# Patient Record
Sex: Male | Born: 1990 | Race: White | Hispanic: No | Marital: Single | Smoking: Current every day smoker
Health system: Southern US, Community
[De-identification: ages and names within clinical notes are randomized; demographics above are authoritative.]

## PROBLEM LIST (undated history)

## (undated) DIAGNOSIS — K3580 Unspecified acute appendicitis: Secondary | ICD-10-CM

---

## 2012-07-01 ENCOUNTER — Emergency Department (HOSPITAL_COMMUNITY): Payer: BC Managed Care – PPO

## 2012-07-01 ENCOUNTER — Encounter (HOSPITAL_COMMUNITY)
Admission: EM | Disposition: A | Discharge status: Home / Self Care | Payer: Self-pay | Source: Home / Self Care | Attending: Emergency Medicine

## 2012-07-01 ENCOUNTER — Encounter (HOSPITAL_COMMUNITY): Payer: Self-pay | Admitting: Registered Nurse

## 2012-07-01 ENCOUNTER — Encounter (HOSPITAL_COMMUNITY): Payer: Self-pay | Admitting: Emergency Medicine

## 2012-07-01 ENCOUNTER — Observation Stay (HOSPITAL_COMMUNITY)
Admission: EM | Admit: 2012-07-01 | Discharge: 2012-07-02 | Disposition: A | Discharge status: Home / Self Care | Payer: BC Managed Care – PPO | Attending: General Surgery | Admitting: General Surgery

## 2012-07-01 ENCOUNTER — Emergency Department (HOSPITAL_COMMUNITY): Payer: BC Managed Care – PPO | Admitting: Registered Nurse

## 2012-07-01 DIAGNOSIS — F17200 Nicotine dependence, unspecified, uncomplicated: Secondary | ICD-10-CM | POA: Insufficient documentation

## 2012-07-01 DIAGNOSIS — K3580 Unspecified acute appendicitis: Secondary | ICD-10-CM | POA: Diagnosis present

## 2012-07-01 DIAGNOSIS — R1031 Right lower quadrant pain: Principal | ICD-10-CM | POA: Insufficient documentation

## 2012-07-01 HISTORY — PX: LAPAROSCOPIC APPENDECTOMY: SHX408

## 2012-07-01 HISTORY — DX: Unspecified acute appendicitis: K35.80

## 2012-07-01 LAB — COMPREHENSIVE METABOLIC PANEL
AST: 35 U/L (ref 0–37)
Albumin: 4.6 g/dL (ref 3.5–5.2)
Chloride: 99 mEq/L (ref 96–112)
Creatinine, Ser: 1.02 mg/dL (ref 0.50–1.35)
Potassium: 3.8 mEq/L (ref 3.5–5.1)
Total Bilirubin: 0.6 mg/dL (ref 0.3–1.2)

## 2012-07-01 LAB — CBC WITH DIFFERENTIAL/PLATELET
Eosinophils Relative: 0 % (ref 0–5)
HCT: 42 % (ref 39.0–52.0)
Hemoglobin: 15 g/dL (ref 13.0–17.0)
Lymphocytes Relative: 6 % — ABNORMAL LOW (ref 12–46)
Lymphs Abs: 1.3 10*3/uL (ref 0.7–4.0)
MCV: 87.9 fL (ref 78.0–100.0)
Monocytes Absolute: 0.7 10*3/uL (ref 0.1–1.0)
Monocytes Relative: 3 % (ref 3–12)
RDW: 12 % (ref 11.5–15.5)
WBC: 23.5 10*3/uL — ABNORMAL HIGH (ref 4.0–10.5)

## 2012-07-01 SURGERY — APPENDECTOMY, LAPAROSCOPIC
Anesthesia: General | Site: Abdomen | Wound class: Contaminated

## 2012-07-01 MED ORDER — 0.9 % SODIUM CHLORIDE (POUR BTL) OPTIME
TOPICAL | Status: DC | PRN
  Administered 2012-07-01: 1000 mL

## 2012-07-01 MED ORDER — LACTATED RINGERS IV SOLN: INTRAVENOUS | Status: DC

## 2012-07-01 MED ORDER — PROMETHAZINE HCL 25 MG/ML IJ SOLN: 6.2500 mg | INTRAMUSCULAR | Status: DC | PRN

## 2012-07-01 MED ORDER — LACTATED RINGERS IR SOLN
Status: DC | PRN
  Administered 2012-07-01: 1000 mL

## 2012-07-01 MED ORDER — IOHEXOL 300 MG/ML  SOLN
100.0000 mL | Freq: Once | INTRAMUSCULAR | Status: AC | PRN
  Administered 2012-07-01: 100 mL via INTRAVENOUS

## 2012-07-01 MED ORDER — CIPROFLOXACIN IN D5W 400 MG/200ML IV SOLN
400.0000 mg | Freq: Once | INTRAVENOUS | Status: AC
  Administered 2012-07-01: 400 mg via INTRAVENOUS
  Filled 2012-07-01: qty 200

## 2012-07-01 MED ORDER — METRONIDAZOLE IN NACL 5-0.79 MG/ML-% IV SOLN
500.0000 mg | Freq: Three times a day (TID) | INTRAVENOUS | Status: DC
  Administered 2012-07-01: 500 mg via INTRAVENOUS
  Filled 2012-07-01 (×2): qty 100

## 2012-07-01 MED ORDER — SUCCINYLCHOLINE CHLORIDE 20 MG/ML IJ SOLN
INTRAMUSCULAR | Status: DC | PRN
  Administered 2012-07-01: 200 mg via INTRAVENOUS

## 2012-07-01 MED ORDER — HYDROMORPHONE HCL PF 1 MG/ML IJ SOLN: 0.2500 mg | INTRAMUSCULAR | Status: DC | PRN

## 2012-07-01 MED ORDER — IOHEXOL 300 MG/ML  SOLN
50.0000 mL | Freq: Once | INTRAMUSCULAR | Status: AC | PRN
  Administered 2012-07-01: 50 mL via ORAL

## 2012-07-01 MED ORDER — ONDANSETRON HCL 4 MG/2ML IJ SOLN
4.0000 mg | Freq: Once | INTRAMUSCULAR | Status: AC
  Administered 2012-07-01: 4 mg via INTRAVENOUS
  Filled 2012-07-01: qty 2

## 2012-07-01 MED ORDER — LIDOCAINE-EPINEPHRINE (PF) 1 %-1:200000 IJ SOLN
INTRAMUSCULAR | Status: AC
  Filled 2012-07-01: qty 10

## 2012-07-01 MED ORDER — ROCURONIUM BROMIDE 100 MG/10ML IV SOLN
INTRAVENOUS | Status: DC | PRN
  Administered 2012-07-01: 30 mg via INTRAVENOUS
  Administered 2012-07-01: 10 mg via INTRAVENOUS

## 2012-07-01 MED ORDER — PROPOFOL 10 MG/ML IV BOLUS
INTRAVENOUS | Status: DC | PRN
  Administered 2012-07-01: 200 mg via INTRAVENOUS

## 2012-07-01 MED ORDER — HYDROMORPHONE HCL PF 1 MG/ML IJ SOLN
1.0000 mg | Freq: Once | INTRAMUSCULAR | Status: AC
  Administered 2012-07-01: 1 mg via INTRAVENOUS
  Filled 2012-07-01: qty 1

## 2012-07-01 MED ORDER — DEXAMETHASONE SODIUM PHOSPHATE 10 MG/ML IJ SOLN
INTRAMUSCULAR | Status: DC | PRN
  Administered 2012-07-01: 10 mg via INTRAVENOUS

## 2012-07-01 MED ORDER — SODIUM CHLORIDE 0.9 % IV BOLUS (SEPSIS)
1000.0000 mL | Freq: Once | INTRAVENOUS | Status: AC
  Administered 2012-07-01: 1000 mL via INTRAVENOUS

## 2012-07-01 MED ORDER — LACTATED RINGERS IV SOLN
INTRAVENOUS | Status: DC | PRN
  Administered 2012-07-01: 23:00:00 via INTRAVENOUS

## 2012-07-01 MED ORDER — MIDAZOLAM HCL 5 MG/5ML IJ SOLN
INTRAMUSCULAR | Status: DC | PRN
  Administered 2012-07-01: 2 mg via INTRAVENOUS

## 2012-07-01 MED ORDER — LIDOCAINE HCL (CARDIAC) 20 MG/ML IV SOLN
INTRAVENOUS | Status: DC | PRN
  Administered 2012-07-01: 100 mg via INTRAVENOUS

## 2012-07-01 MED ORDER — FENTANYL CITRATE 0.05 MG/ML IJ SOLN
INTRAMUSCULAR | Status: DC | PRN
  Administered 2012-07-01 – 2012-07-02 (×5): 50 ug via INTRAVENOUS

## 2012-07-01 MED ORDER — BUPIVACAINE HCL (PF) 0.25 % IJ SOLN
INTRAMUSCULAR | Status: AC
  Filled 2012-07-01: qty 30

## 2012-07-01 SURGICAL SUPPLY — 38 items
APPLIER CLIP ROT 10 11.4 M/L (STAPLE)
CANISTER SUCTION 2500CC (MISCELLANEOUS) IMPLANT
CHLORAPREP W/TINT 26ML (MISCELLANEOUS) ×2 IMPLANT
CLIP APPLIE ROT 10 11.4 M/L (STAPLE) IMPLANT
CLOTH BEACON ORANGE TIMEOUT ST (SAFETY) ×2 IMPLANT
COVER SURGICAL LIGHT HANDLE (MISCELLANEOUS) ×2 IMPLANT
CUTTER FLEX LINEAR 45M (STAPLE) ×4 IMPLANT
DECANTER SPIKE VIAL GLASS SM (MISCELLANEOUS) ×6 IMPLANT
DERMABOND ADVANCED (GAUZE/BANDAGES/DRESSINGS) ×1
DERMABOND ADVANCED .7 DNX12 (GAUZE/BANDAGES/DRESSINGS) ×1 IMPLANT
ELECT REM PT RETURN 9FT ADLT (ELECTROSURGICAL) ×2
ELECTRODE REM PT RTRN 9FT ADLT (ELECTROSURGICAL) ×1 IMPLANT
GLOVE SURG SS PI 7.5 STRL IVOR (GLOVE) ×4 IMPLANT
GOWN STRL NON-REIN LRG LVL3 (GOWN DISPOSABLE) ×4 IMPLANT
GOWN STRL REIN XL XLG (GOWN DISPOSABLE) ×2 IMPLANT
HANDLE STAPLE EGIA 4 XL (STAPLE) ×2 IMPLANT
KIT BASIN OR (CUSTOM PROCEDURE TRAY) ×2 IMPLANT
KIT ROOM TURNOVER OR (KITS) ×2 IMPLANT
NS IRRIG 1000ML POUR BTL (IV SOLUTION) ×2 IMPLANT
PAD ARMBOARD 7.5X6 YLW CONV (MISCELLANEOUS) ×4 IMPLANT
POUCH SPECIMEN RETRIEVAL 10MM (ENDOMECHANICALS) ×2 IMPLANT
RELOAD 45 VASCULAR/THIN (ENDOMECHANICALS) IMPLANT
RELOAD EGIA 45 MED/THCK PURPLE (STAPLE) ×2 IMPLANT
RELOAD STAPLE TA45 3.5 REG BLU (ENDOMECHANICALS) IMPLANT
SCALPEL HARMONIC ACE (MISCELLANEOUS) ×2 IMPLANT
SCISSORS LAP 5X35 DISP (ENDOMECHANICALS) IMPLANT
SET IRRIG TUBING LAPAROSCOPIC (IRRIGATION / IRRIGATOR) ×2 IMPLANT
SLEEVE ENDOPATH XCEL 5M (ENDOMECHANICALS) ×2 IMPLANT
SPECIMEN JAR SMALL (MISCELLANEOUS) ×2 IMPLANT
SUT MNCRL AB 4-0 PS2 18 (SUTURE) ×2 IMPLANT
SUT VICRYL 0 UR6 27IN ABS (SUTURE) ×2 IMPLANT
TOWEL OR 17X24 6PK STRL BLUE (TOWEL DISPOSABLE) ×2 IMPLANT
TOWEL OR 17X26 10 PK STRL BLUE (TOWEL DISPOSABLE) ×2 IMPLANT
TRAY FOLEY CATH 14FR (SET/KITS/TRAYS/PACK) ×2 IMPLANT
TRAY LAP CHOLE (CUSTOM PROCEDURE TRAY) ×2 IMPLANT
TROCAR XCEL BLUNT TIP 100MML (ENDOMECHANICALS) ×2 IMPLANT
TROCAR XCEL NON-BLD 5MMX100MML (ENDOMECHANICALS) ×2 IMPLANT
WATER STERILE IRR 1000ML POUR (IV SOLUTION) IMPLANT

## 2012-07-01 NOTE — H&P (Signed)
Reason for Consult:appendicitis Referring Physician: [name] is an 22 y.o. male.  HPI: this patient comes to the emergency room for evaluation of abdominal pain. He awoke this morning at about 10 AM not feeling right but not really having any discomfort either. He went to work as throughout the day of he continued to feel ill and bloated. He began having some generalized abdominal pain which throughout the day localized to the right lower quadrant. He thought that he would get some improvement if he moved his bowels but he got no relief with a small bowel movement. He began having some nausea and vomiting but denied any other symptoms such as fevers or chills or urinary symptoms. He presented to an urgent care facility where it was felt that he had appendicitis which is 21,000 white count and he was referred to the emergency room for further evaluation. In the emergency room he has continued to have persistent right lower quadrant pain, a white blood cell count of 23,000, and a CT scan was obtained which demonstrated signs concerning for acute appendicitis.  History reviewed. No pertinent past medical history.  History reviewed. No pertinent past surgical history.  Family History  Problem Relation Age of Onset  . Cancer Other     Social History:  reports that he has been smoking.  He does not have any smokeless tobacco history on file. He reports that  drinks alcohol. He reports that he does not use illicit drugs.  Allergies:  Allergies  Allergen Reactions  . Amoxicillin   . Sulfa Antibiotics     Medications: I have reviewed the patient's current medications.  Results for orders placed during the hospital encounter of 07/01/12 (from the past 48 hour(s))  COMPREHENSIVE METABOLIC PANEL     Status: Abnormal   Collection Time    07/01/12  7:35 PM      Result Value Range   Sodium 135  135 - 145 mEq/L   Potassium 3.8  3.5 - 5.1 mEq/L   Chloride 99  96 - 112 mEq/L   CO2 26   19 - 32 mEq/L   Glucose, Bld 114 (*) 70 - 99 mg/dL   BUN 10  6 - 23 mg/dL   Creatinine, Ser 1.61  0.50 - 1.35 mg/dL   Calcium 9.8  8.4 - 09.6 mg/dL   Total Protein 8.1  6.0 - 8.3 g/dL   Albumin 4.6  3.5 - 5.2 g/dL   AST 35  0 - 37 U/L   ALT 58 (*) 0 - 53 U/L   Alkaline Phosphatase 70  39 - 117 U/L   Total Bilirubin 0.6  0.3 - 1.2 mg/dL   GFR calc non Af Amer >90  >90 mL/min   GFR calc Af Amer >90  >90 mL/min   Comment:            The eGFR has been calculated     using the CKD EPI equation.     This calculation has not been     validated in all clinical     situations.     eGFR's persistently     <90 mL/min signify     possible Chronic Kidney Disease.  LIPASE, BLOOD     Status: None   Collection Time    07/01/12  7:35 PM      Result Value Range   Lipase 22  11 - 59 U/L  CBC WITH DIFFERENTIAL     Status: Abnormal  Collection Time    07/01/12  7:35 PM      Result Value Range   WBC 23.5 (*) 4.0 - 10.5 K/uL   RBC 4.78  4.22 - 5.81 MIL/uL   Hemoglobin 15.0  13.0 - 17.0 g/dL   HCT 40.9  81.1 - 91.4 %   MCV 87.9  78.0 - 100.0 fL   MCH 31.4  26.0 - 34.0 pg   MCHC 35.7  30.0 - 36.0 g/dL   RDW 78.2  95.6 - 21.3 %   Platelets 233  150 - 400 K/uL   Neutrophils Relative 91 (*) 43 - 77 %   Neutro Abs 21.4 (*) 1.7 - 7.7 K/uL   Lymphocytes Relative 6 (*) 12 - 46 %   Lymphs Abs 1.3  0.7 - 4.0 K/uL   Monocytes Relative 3  3 - 12 %   Monocytes Absolute 0.7  0.1 - 1.0 K/uL   Eosinophils Relative 0  0 - 5 %   Eosinophils Absolute 0.0  0.0 - 0.7 K/uL   Basophils Relative 0  0 - 1 %   Basophils Absolute 0.0  0.0 - 0.1 K/uL    Ct Abdomen Pelvis W Contrast  07/01/2012  *RADIOLOGY REPORT*  Clinical Data: Right lower quadrant pain.  CT ABDOMEN AND PELVIS WITH CONTRAST  Technique:  Multidetector CT imaging of the abdomen and pelvis was performed following the standard protocol during bolus administration of intravenous contrast.  Contrast:  100 ml Omnipaque-300.  Comparison: 04/19/2010   Findings: Lung bases are clear.  No effusions.  Heart is normal size.  Liver, gallbladder, stomach, spleen, pancreas, adrenals and kidneys are unremarkable.  Tiny low density area within the mid pole of the right kidney compatible with small cysts, stable since prior study.  There is enlargement of the retrocecal appendix with surrounding inflammatory changes compatible with acute appendicitis.  No evidence of perforation at this time.  Large and small bowel are unremarkable.  Aorta and iliac vessels are normal caliber.  No acute bony abnormality.  IMPRESSION: Changes of acute appendicitis.  Appendix is retrocecal.   Original Report Authenticated By: Charlett Nose, M.D.     All other review of systems negative or noncontributory except as stated in the HPI   Blood pressure 124/67, pulse 78, temperature 98.3 F (36.8 C), temperature source Oral, resp. rate 16, SpO2 98.00%. General appearance: alert, cooperative and no distress Head: Normocephalic, without obvious abnormality, atraumatic Neck: no JVD and supple, symmetrical, trachea midline Resp: nonlabored Cardio: normal rate, regular GI: soft, focal right lower and right sided tenderness, with voluntary guarding, ND Extremities: extremities normal, atraumatic, no cyanosis or edema Skin: Skin color, texture, turgor normal. No rashes or lesions Neurologic: Grossly normal  Assessment/Plan: Abdominal pain, most likely acute appendicitis He has a history and physical exam which is fairly classic for acute appendicitis. He also has an elevated white blood cell count of 23,000 and a CT scan also concerning for acute appendicitis. I have reviewed his x-rays and I have recommended appendectomy for the patient.  I discussed with him the options and the pros and cons and risks and benefits and he would like to proceed with diagnostic laparoscopy, appendectomy and possible open surgery. I did discuss with the patient the risks of infection, bleeding, pain,  scarring, need for open surgery, injury to bowel or surrounding structures, abscess, postoperative infection and injury to the ureters and he expressed understanding and would like to proceed with diagnostic laparoscopy and appendectomy.  Lodema Pilot  DAVID 07/01/2012, 10:21 PM

## 2012-07-01 NOTE — ED Provider Notes (Signed)
History     CSN: [id number]  Arrival date & time 07/01/12  1916   First MD Initiated Contact with Patient 07/01/12 1928      Chief Complaint  Patient presents with  . Abdominal Pain     HPI Pt is c/o right lower quadrant pain and pain around the umbilicus that started about noon today Pt went to [facility] and was sent here to r/o appendicitis FPt had lab work done there and wbc was 21.4   History reviewed. No pertinent past medical history.  History reviewed. No pertinent past surgical history.  Family History  Problem Relation Age of Onset  . Cancer Other     History  Substance Use Topics  . Smoking status: Current Every Day Smoker  . Smokeless tobacco: Not on file  . Alcohol Use: Yes     Comment: occ      Review of Systems All other systems reviewed and are negative Allergies  Amoxicillin and Sulfa antibiotics  Home Medications  No current outpatient prescriptions on file.  BP 124/67  Pulse 78  Temp(Src) 98.3 F (36.8 C) (Oral)  Resp 16  SpO2 98%  Physical Exam  Nursing note and vitals reviewed. Constitutional: He is oriented to person, place, and time. He appears well-developed and well-nourished. No distress.  HENT:  Head: Normocephalic and atraumatic.  Eyes: Pupils are equal, round, and reactive to light.  Neck: Normal range of motion.  Cardiovascular: Normal rate and intact distal pulses.   Pulmonary/Chest: No respiratory distress.  Abdominal: Normal appearance. He exhibits no distension. There is tenderness in the right lower quadrant. There is guarding.  Musculoskeletal: Normal range of motion.  Neurological: He is alert and oriented to person, place, and time. No cranial nerve deficit.  Skin: Skin is warm and dry. No rash noted.  Psychiatric: He has a normal mood and affect. His behavior is normal.    ED Course  Procedures (including critical care time)  Labs Reviewed  COMPREHENSIVE METABOLIC PANEL - Abnormal; Notable  for the following:    Glucose, Bld 114 (*)    ALT 58 (*)    All other components within normal limits  CBC WITH DIFFERENTIAL - Abnormal; Notable for the following:    WBC 23.5 (*)    Neutrophils Relative 91 (*)    Neutro Abs 21.4 (*)    Lymphocytes Relative 6 (*)    All other components within normal limits  LIPASE, BLOOD   Ct Abdomen Pelvis W Contrast  07/01/2012  *RADIOLOGY REPORT*  Clinical Data: Right lower quadrant pain.  CT ABDOMEN AND PELVIS WITH CONTRAST  Technique:  Multidetector CT imaging of the abdomen and pelvis was performed following the standard protocol during bolus administration of intravenous contrast.  Contrast:  100 ml Omnipaque-300.  Comparison: 04/19/2010  Findings: Lung bases are clear.  No effusions.  Heart is normal size.  Liver, gallbladder, stomach, spleen, pancreas, adrenals and kidneys are unremarkable.  Tiny low density area within the mid pole of the right kidney compatible with small cysts, stable since prior study.  There is enlargement of the retrocecal appendix with surrounding inflammatory changes compatible with acute appendicitis.  No evidence of perforation at this time.  Large and small bowel are unremarkable.  Aorta and iliac vessels are normal caliber.  No acute bony abnormality.  IMPRESSION: Changes of acute appendicitis.  Appendix is retrocecal.   Original Report Authenticated By: Charlett Nose, M.D.      1. Acute appendicitis  MDM         Nelia Shi, MD 07/01/12 2133

## 2012-07-01 NOTE — Anesthesia Preprocedure Evaluation (Addendum)
Anesthesia Evaluation  Patient identified by MRN, date of birth, ID band Patient awake    Reviewed: Allergy & Precautions, H&P , NPO status , Patient's Chart, lab work & pertinent test results  Airway Mallampati: II TM Distance: >3 FB Neck ROM: Full    Dental  (+) Teeth Intact and Dental Advisory Given   Pulmonary Current Smoker,  breath sounds clear to auscultation  Pulmonary exam normal       Cardiovascular negative cardio ROS  Rhythm:Regular Rate:Normal     Neuro/Psych negative neurological ROS  negative psych ROS   GI/Hepatic negative GI ROS, Neg liver ROS,   Endo/Other  negative endocrine ROS  Renal/GU negative Renal ROS  negative genitourinary   Musculoskeletal negative musculoskeletal ROS (+)   Abdominal   Peds  Hematology negative hematology ROS (+)   Anesthesia Other Findings   Reproductive/Obstetrics negative OB ROS                           Anesthesia Physical Anesthesia Plan  ASA: I and emergent  Anesthesia Plan: General   Post-op Pain Management:    Induction: Intravenous, Rapid sequence and Cricoid pressure planned  Airway Management Planned: Oral ETT  Additional Equipment:   Intra-op Plan:   Post-operative Plan: Extubation in OR  Informed Consent: I have reviewed the patients History and Physical, chart, labs and discussed the procedure including the risks, benefits and alternatives for the proposed anesthesia with the patient or authorized representative who has indicated his/her understanding and acceptance.     Plan Discussed with: CRNA  Anesthesia Plan Comments:         Anesthesia Quick Evaluation

## 2012-07-01 NOTE — ED Notes (Signed)
Pt is c/o right lower quadrant pain and pain around the umbilicus that started about noon today  Pt went to [facility] and was sent here to r/o appendicitis  FPt had lab work done there and wbc was 21.4

## 2012-07-01 NOTE — Preoperative (Signed)
Beta Blockers   Reason not to administer Beta Blockers:Not Applicable 

## 2012-07-02 ENCOUNTER — Encounter (HOSPITAL_COMMUNITY): Payer: Self-pay | Admitting: General Surgery

## 2012-07-02 ENCOUNTER — Encounter (INDEPENDENT_AMBULATORY_CARE_PROVIDER_SITE_OTHER): Payer: Self-pay | Admitting: General Surgery

## 2012-07-02 DIAGNOSIS — K3580 Unspecified acute appendicitis: Secondary | ICD-10-CM | POA: Diagnosis present

## 2012-07-02 HISTORY — DX: Unspecified acute appendicitis: K35.80

## 2012-07-02 LAB — CBC WITH DIFFERENTIAL/PLATELET
Basophils Absolute: 0 10*3/uL (ref 0.0–0.1)
Basophils Relative: 0 % (ref 0–1)
Eosinophils Absolute: 0 10*3/uL (ref 0.0–0.7)
Eosinophils Relative: 0 % (ref 0–5)
HCT: 38.9 % — ABNORMAL LOW (ref 39.0–52.0)
Hemoglobin: 13.5 g/dL (ref 13.0–17.0)
MCH: 30.7 pg (ref 26.0–34.0)
MCHC: 34.7 g/dL (ref 30.0–36.0)
MCV: 88.4 fL (ref 78.0–100.0)
Monocytes Absolute: 0.2 10*3/uL (ref 0.1–1.0)
Monocytes Relative: 1 % — ABNORMAL LOW (ref 3–12)
RDW: 12 % (ref 11.5–15.5)

## 2012-07-02 MED ORDER — ENOXAPARIN SODIUM 40 MG/0.4ML ~~LOC~~ SOLN
40.0000 mg | Freq: Every day | SUBCUTANEOUS | Status: DC
  Filled 2012-07-02: qty 0.4

## 2012-07-02 MED ORDER — ONDANSETRON HCL 4 MG/2ML IJ SOLN: 4.0000 mg | Freq: Four times a day (QID) | INTRAMUSCULAR | Status: DC | PRN

## 2012-07-02 MED ORDER — NEOSTIGMINE METHYLSULFATE 1 MG/ML IJ SOLN
INTRAMUSCULAR | Status: DC | PRN
  Administered 2012-07-02: 5 mg via INTRAVENOUS

## 2012-07-02 MED ORDER — ONDANSETRON HCL 4 MG/2ML IJ SOLN
INTRAMUSCULAR | Status: DC | PRN
  Administered 2012-07-02: 4 mg via INTRAVENOUS

## 2012-07-02 MED ORDER — MORPHINE SULFATE 2 MG/ML IJ SOLN
1.0000 mg | INTRAMUSCULAR | Status: DC | PRN
  Administered 2012-07-02: 2 mg via INTRAVENOUS
  Filled 2012-07-02: qty 1

## 2012-07-02 MED ORDER — LACTATED RINGERS IV SOLN
INTRAVENOUS | Status: DC
  Administered 2012-07-02: 125 mL/h via INTRAVENOUS

## 2012-07-02 MED ORDER — CIPROFLOXACIN IN D5W 400 MG/200ML IV SOLN
400.0000 mg | Freq: Once | INTRAVENOUS | Status: AC
  Administered 2012-07-02: 400 mg via INTRAVENOUS
  Filled 2012-07-02: qty 200

## 2012-07-02 MED ORDER — ACETAMINOPHEN 325 MG PO TABS: ORAL_TABLET | ORAL | Status: DC

## 2012-07-02 MED ORDER — HYDROCODONE-ACETAMINOPHEN 5-325 MG PO TABS
1.0000 | ORAL_TABLET | ORAL | Status: DC | PRN
  Administered 2012-07-02 (×3): 2 via ORAL
  Filled 2012-07-02 (×3): qty 2

## 2012-07-02 MED ORDER — HYDROCODONE-ACETAMINOPHEN 5-325 MG PO TABS: 1.0000 | ORAL_TABLET | ORAL | Status: DC | PRN

## 2012-07-02 MED ORDER — METRONIDAZOLE IN NACL 5-0.79 MG/ML-% IV SOLN
500.0000 mg | Freq: Three times a day (TID) | INTRAVENOUS | Status: DC
  Administered 2012-07-02: 500 mg via INTRAVENOUS
  Filled 2012-07-02 (×2): qty 100

## 2012-07-02 MED ORDER — GLYCOPYRROLATE 0.2 MG/ML IJ SOLN
INTRAMUSCULAR | Status: DC | PRN
  Administered 2012-07-02: .8 mg via INTRAVENOUS

## 2012-07-02 MED ORDER — ONDANSETRON HCL 4 MG PO TABS: 4.0000 mg | ORAL_TABLET | Freq: Four times a day (QID) | ORAL | Status: DC | PRN

## 2012-07-02 MED ORDER — KETOROLAC TROMETHAMINE 30 MG/ML IJ SOLN
30.0000 mg | Freq: Once | INTRAMUSCULAR | Status: AC | PRN
  Administered 2012-07-02: 30 mg via INTRAVENOUS
  Filled 2012-07-02: qty 1

## 2012-07-02 MED ORDER — LIDOCAINE-EPINEPHRINE (PF) 1 %-1:200000 IJ SOLN
INTRAMUSCULAR | Status: DC | PRN
  Administered 2012-07-02: 15 mL

## 2012-07-02 MED ORDER — BUPIVACAINE HCL 0.25 % IJ SOLN
INTRAMUSCULAR | Status: DC | PRN
  Administered 2012-07-02: 15 mL

## 2012-07-02 NOTE — Progress Notes (Signed)
1 Day Post-Op  Subjective: Pretty sore when I came in but he has been up walking and feels better now.  Able to eat this AM.  Objective: Vital signs in last 24 hours: Temp:  [97.8 F (36.6 C)-98.3 F (36.8 C)] 97.8 F (36.6 C) (02/27 0615) Pulse Rate:  [64-108] 74 (02/27 0615) Resp:  [15-20] 18 (02/27 0615) BP: (100-165)/(57-81) 100/61 mmHg (02/27 0615) SpO2:  [96 %-100 %] 96 % (02/27 0615) Weight:  [191 lb (86.637 kg)] 191 lb (86.637 kg) (02/27 0141) Last BM Date: 07/01/12 WBC down to 12.6, Diet regular  Intake/Output from previous day: 02/26 0701 - 02/27 0700 In: 2800 [P.O.:600; I.V.:2200] Out: 1610 [Urine:1600; Blood:10] Intake/Output this shift:    General appearance: alert, cooperative and no distress GI: soft, still tender, incisions look fine.  Lab Results:   Recent Labs  07/01/12 1935 07/02/12 0445  WBC 23.5* 12.6*  HGB 15.0 13.5  HCT 42.0 38.9*  PLT 233 207    BMET  Recent Labs  07/01/12 1935  NA 135  K 3.8  CL 99  CO2 26  GLUCOSE 114*  BUN 10  CREATININE 1.02  CALCIUM 9.8   PT/INR No results found for this basename: LABPROT, INR,  in the last 72 hours   Recent Labs Lab 07/01/12 1935  AST 35  ALT 58*  ALKPHOS 70  BILITOT 0.6  PROT 8.1  ALBUMIN 4.6     Lipase     Component Value Date/Time   LIPASE 22 07/01/2012 1935     Studies/Results: Ct Abdomen Pelvis W Contrast  07/01/2012  *RADIOLOGY REPORT*  Clinical Data: Right lower quadrant pain.  CT ABDOMEN AND PELVIS WITH CONTRAST  Technique:  Multidetector CT imaging of the abdomen and pelvis was performed following the standard protocol during bolus administration of intravenous contrast.  Contrast:  100 ml Omnipaque-300.  Comparison: 04/19/2010  Findings: Lung bases are clear.  No effusions.  Heart is normal size.  Liver, gallbladder, stomach, spleen, pancreas, adrenals and kidneys are unremarkable.  Tiny low density area within the mid pole of the right kidney compatible with small  cysts, stable since prior study.  There is enlargement of the retrocecal appendix with surrounding inflammatory changes compatible with acute appendicitis.  No evidence of perforation at this time.  Large and small bowel are unremarkable.  Aorta and iliac vessels are normal caliber.  No acute bony abnormality.  IMPRESSION: Changes of acute appendicitis.  Appendix is retrocecal.   Original Report Authenticated By: Charlett Nose, M.D.     Medications: . ciprofloxacin  400 mg Intravenous Once  . enoxaparin (LOVENOX) injection  40 mg Subcutaneous Daily  . metronidazole  500 mg Intravenous Q8H    Assessment/Plan Acute appendicitis s/p Laparoscopic appendectomy. 07/02/12 Dr. Lodema Pilot.    Plan:  Hope to send home later today.  LOS: 1 day    Danique Hartsough 07/02/2012

## 2012-07-02 NOTE — Discharge Summary (Signed)
Physician Discharge Summary  Patient ID: Nathaniel Cabrera MRN: [id number] DOB/AGE: [birth year] 21 y.o.  Admit date: 07/01/2012 Discharge date: 07/02/2012  Admission Diagnoses: Acute appendicitis    Discharge Diagnoses: Acute appendicitis   Principal Problem:   Acute appendicitis without mention of peritonitis   PROCEDURES:  s/p Laparoscopic appendectomy. 07/02/12 Dr. Lodema Pilot.      Hospital Course:  this patient comes to the emergency room for evaluation of abdominal pain. He awoke this morning at about 10 AM not feeling right but not really having any discomfort either. He went to work as throughout the day of he continued to feel ill and bloated. He began having some generalized abdominal pain which throughout the day localized to the right lower quadrant. He thought that he would get some improvement if he moved his bowels but he got no relief with a small bowel movement. He began having some nausea and vomiting but denied any other symptoms such as fevers or chills or urinary symptoms. He presented to an urgent care facility where it was felt that he had appendicitis which is 21,000 white count and he was referred to the emergency room for further evaluation. In the emergency room he has continued to have persistent right lower quadrant pain, a white blood cell count of 23,000, and a CT scan was obtained which demonstrated signs concerning for acute appendicitis. He was seen in the ER by Dr. Biagio Quint, admitted and taken to the OR that evening.  He did well post op, his diet was advanced and he was ready for D/C after lunch the next day. Follow up 2 weeks:  D[facility] clinic or Dr. Biagio Quint.  Condition on d/c:  Improved.   Disposition: Home     Medication List    TAKE these medications       acetaminophen 325 MG tablet  Commonly known as:  TYLENOL  Do not take more than 4000 mg of tylenol per day.     HYDROcodone-acetaminophen 5-325 MG per tablet  Commonly known as:  NORCO/VICODIN   Take 1-2 tablets by mouth every 4 (four) hours as needed.       Follow-up Information   Schedule an appointment as soon as possible for a visit with LAYTON, BRIAN DAVID, DO. (Call for an appointment in 2 weeks.  Ask for the DOW clinic or DR. Biagio Quint if the DOW clinic is full.)    Contact information:   [address] [place] [address] [phone]       Signed: Sherrie George 07/02/2012, 12:53 PM

## 2012-07-02 NOTE — Anesthesia Postprocedure Evaluation (Signed)
Anesthesia Post Note  Patient: Nathaniel Cabrera  Procedure(s) Performed: Procedure(s) (LRB): APPENDECTOMY LAPAROSCOPIC (N/A)  Anesthesia type: General  Patient location: PACU  Post pain: Pain level controlled  Post assessment: Post-op Vital signs reviewed  Last Vitals:  Filed Vitals:   07/02/12 0045  BP:   Pulse:   Temp: 36.8 C  Resp:     Post vital signs: Reviewed  Level of consciousness: sedated  Complications: No apparent anesthesia complications

## 2012-07-02 NOTE — Brief Op Note (Signed)
07/01/2012 - 07/02/2012  12:49 AM  PATIENT:  Nathaniel Cabrera  22 y.o. male  PRE-OPERATIVE DIAGNOSIS:  appendicitis  POST-OPERATIVE DIAGNOSIS:  appendicitis  PROCEDURE:  Procedure(s): APPENDECTOMY LAPAROSCOPIC (N/A)  SURGEON:  Surgeon(s) and Role:    * Lodema Pilot, DO - Primary  PHYSICIAN ASSISTANT:   ASSISTANTS: none   ANESTHESIA:   general  EBL:  Total I/O In: 2200 [I.V.:2200] Out: 310 [Urine:300; Blood:10]  BLOOD ADMINISTERED:none  DRAINS: none   LOCAL MEDICATIONS USED:  MARCAINE    and LIDOCAINE   SPECIMEN:  Source of Specimen:  appendix  DISPOSITION OF SPECIMEN:  PATHOLOGY  COUNTS:  YES  TOURNIQUET:  * No tourniquets in log *  DICTATION: .Other Dictation: Dictation Number [id number]  PLAN OF CARE: Admit for overnight observation  PATIENT DISPOSITION:  PACU - hemodynamically stable.   Delay start of Pharmacological VTE agent (>24hrs) due to surgical blood loss or risk of bleeding: no

## 2012-07-02 NOTE — Progress Notes (Signed)
Agree with A&P of WJ,PA. Believe he will be able to go home later today

## 2012-07-02 NOTE — Progress Notes (Signed)
Report to minnie rn. abd dsgs remain clean dry and intact

## 2012-07-02 NOTE — Transfer of Care (Signed)
Immediate Anesthesia Transfer of Care Note  Patient: Nathaniel Cabrera  Procedure(s) Performed: Procedure(s): APPENDECTOMY LAPAROSCOPIC (N/A)  Patient Location: PACU  Anesthesia Type:General  Level of Consciousness: awake, alert , oriented and patient cooperative  Airway & Oxygen Therapy: Patient Spontanous Breathing and Patient connected to face mask oxygen  Post-op Assessment: Report given to PACU RN, Post -op Vital signs reviewed and stable and Patient moving all extremities  Post vital signs: Reviewed and stable  Complications: No apparent anesthesia complications

## 2012-07-02 NOTE — Op Note (Signed)
NAME:  Nathaniel Cabrera, Nathaniel Cabrera NO.:  [id number]  MEDICAL RECORD NO.:  [id number]  LOCATION:  1538                         FACILITY:  [facility]  PHYSICIAN:  Lodema Pilot, MD       DATE OF BIRTH:  [birth year]  DATE OF PROCEDURE:  07/02/2012 DATE OF DISCHARGE:                              OPERATIVE REPORT   PROCEDURE:  Laparoscopic appendectomy.  PREOPERATIVE DIAGNOSIS:  Acute appendicitis.  POSTOPERATIVE DIAGNOSIS:  Acute appendicitis.  SURGEON:  Lodema Pilot, MD  ASSISTANT:  None.  ANESTHESIA:  General endotracheal anesthesia with 30 mL of 1% lidocaine with epinephrine and 0.25% Marcaine in a 50:50 mixture.  FLUIDS:  1200 mL of crystalloid.  ESTIMATED BLOOD LOSS:  Minimal.  DRAINS:  None.  SPECIMENS:  Appendix sent to Pathology for permanent sectioning.  COMPLICATIONS:  None apparent.  FINDINGS:  Early suppurative acute appendicitis.  No evidence of perforation abscess.  Early small bilateral inguinal hernias, greatest on the left.  INDICATIONS FOR PROCEDURE:  Nathaniel Cabrera is a 22 year old male with a 1- day history of increasing abdominal pain focused in the right lower quadrant, white blood cell count 22,000, and physical exam and CT scan concerning for acute appendicitis.  OPERATIVE DETAILS:  Nathaniel Cabrera was seen and evaluated in the emergency room and risks and benefits of procedure were discussed in lay terms. Informed consent was obtained.  He was given therapeutic antibiotics and taken to the operating room, placed on table in supine position. General endotracheal tube anesthesia was obtained and his left arm was tucked and Foley catheter was placed.  He was secured to the bed and his abdomen was prepped and draped in the standard surgical fashion. Procedure time-out was performed with all operative team members to confirm proper patient and procedure.  A supraumbilical midline incision was made in the skin and dissection carried down through  subcutaneous tissue using blunt dissection.  The abdominal wall fascia was elevated and sharply incised.  The peritoneum was entered and a 12-mm balloon port was placed and pneumoperitoneum was obtained.  Laparoscope was introduced.  There was no evidence of bleeding or bowel injury upon entry.  The abdomen was explored and any other small possible early right inguinal hernia and a slightly larger nipple in the left groin consistent also with probable early small left inguinal hernia.  I placed a 5-mm trocar in the lower midline under direct visualization and then, I was able to identify the appendix which was in retrocecal position and did have some small amount of fibrinous exudate and postoperative changes, but this was fairly mobile, but it was consistent with acute appendicitis.  I placed another right upper quadrant trocar and then elevated the appendix.  The base of the appendix was healthy in appearance, and I created a window through the mesoappendix and the appendix was divided at its base with a purple Tri-Staple load.  Then, the mesoappendix was divided with Harmonic scalpel and the appendix was completely removed and placed in an EndoCatch bag and removed from the umbilical trocar site and was sent to Pathology for permanent sectioning.  The staple line appeared hemostatic.  The staples appeared well formed.  There was no  evidence of any impingement on the ileocecal valve.  I did a small amount of local irrigation in the immediate area of the appendix and suctioned the fluid.  Irrigation returned clear. The umbilical trocars were removed and the fascia was approximated with interrupted 0 Vicryl sutures in open fashion.  The sutures were secured and the abdomen was re-insufflated with carbon dioxide gas.  The abdominal wall closure was noted be adequate and the staple line again appeared hemostatic.  The lower midline trocar was removed under direct visualization and abdominal  wall was noted be hemostatic.  The gas was removed and the final right upper quadrant trocar was removed.  The skin edges were approximated with a 4-0 Monocryl subcuticular suture and the skin was washed and dried and Dermabond was applied.  All sponge, needle, and instrument counts correct at the end of the case and Foley catheter was removed at the end of the case.  The patient was stable and ready for transfer to recovery area.          ______________________________ Lodema Pilot, MD     BL/MEDQ  D:  07/02/2012  T:  07/02/2012  Job:  [id number]

## 2012-07-03 ENCOUNTER — Telehealth (INDEPENDENT_AMBULATORY_CARE_PROVIDER_SITE_OTHER): Payer: Self-pay

## 2012-07-03 NOTE — Telephone Encounter (Signed)
Pt just had surgery Wednesday and went home Thursday.  He has a note to go back to work Monday.  His boss said he can have more time but there is no light duty at his job.  He works in a [employer] and does heavy lifting.  He wants a note to be out of work another week or two.  Please call him at [phone].

## 2012-07-06 ENCOUNTER — Telehealth (INDEPENDENT_AMBULATORY_CARE_PROVIDER_SITE_OTHER): Payer: Self-pay | Admitting: General Surgery

## 2012-07-06 ENCOUNTER — Encounter (INDEPENDENT_AMBULATORY_CARE_PROVIDER_SITE_OTHER): Payer: Self-pay

## 2012-07-06 NOTE — Telephone Encounter (Signed)
I called and left the pt a message to call me so I can let him know I did a letter for him for 2 weeks out of work.

## 2012-07-06 NOTE — Telephone Encounter (Signed)
Pt called for refill of pain meds.  Per standing orders for refill, called in Hydrocodone 5/325 mg,  # 30, 1-2 po Q4-6H prn pain, no refill to [facility]:  [phone].  Also, suggested he begin to augment the narcotics with either Aleve or Motrin.   He understands.

## 2012-07-13 ENCOUNTER — Telehealth (INDEPENDENT_AMBULATORY_CARE_PROVIDER_SITE_OTHER): Payer: Self-pay

## 2012-07-13 NOTE — Telephone Encounter (Signed)
Left message for patient to call our office regarding r/s of appointment on 07/17/12.  Dr. Biagio Quint added office for Tuesday 07/14/12 from 2:15 pm - 2:45 pm and 3:45 pm - 4:15pm.

## 2012-07-14 ENCOUNTER — Telehealth (INDEPENDENT_AMBULATORY_CARE_PROVIDER_SITE_OTHER): Payer: Self-pay

## 2012-07-14 NOTE — Telephone Encounter (Signed)
Left voice message for patient to call our office.  Patient appointment need's to be rescheduled due to cxl'd office for Dr. Biagio Quint.

## 2012-07-15 ENCOUNTER — Telehealth (INDEPENDENT_AMBULATORY_CARE_PROVIDER_SITE_OTHER): Payer: Self-pay | Admitting: General Surgery

## 2012-07-15 NOTE — Telephone Encounter (Signed)
Called pt and passed on the message from Dr. Maisie Fus.  He understands and will try alternating.

## 2012-07-15 NOTE — Telephone Encounter (Signed)
He can take alternating tylenol and Ibuprofen for pain relief.  I do not think he needs narcotics.
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
## 2012-07-17 ENCOUNTER — Encounter (INDEPENDENT_AMBULATORY_CARE_PROVIDER_SITE_OTHER): Payer: BC Managed Care – PPO | Admitting: General Surgery

## 2012-08-13 ENCOUNTER — Encounter (INDEPENDENT_AMBULATORY_CARE_PROVIDER_SITE_OTHER): Payer: BC Managed Care – PPO | Admitting: General Surgery

## 2012-09-03 ENCOUNTER — Encounter (INDEPENDENT_AMBULATORY_CARE_PROVIDER_SITE_OTHER): Payer: BC Managed Care – PPO | Admitting: General Surgery

## 2012-09-17 ENCOUNTER — Encounter (INDEPENDENT_AMBULATORY_CARE_PROVIDER_SITE_OTHER): Payer: Self-pay | Admitting: General Surgery

## 2013-05-26 ENCOUNTER — Emergency Department (HOSPITAL_COMMUNITY)
Admission: EM | Admit: 2013-05-26 | Discharge: 2013-05-27 | Disposition: A | Discharge status: Home / Self Care | Payer: BC Managed Care – PPO | Attending: Emergency Medicine | Admitting: Emergency Medicine

## 2013-05-26 ENCOUNTER — Encounter (HOSPITAL_COMMUNITY): Payer: Self-pay | Admitting: Emergency Medicine

## 2013-05-26 DIAGNOSIS — F17200 Nicotine dependence, unspecified, uncomplicated: Secondary | ICD-10-CM | POA: Insufficient documentation

## 2013-05-26 DIAGNOSIS — Z880 Allergy status to penicillin: Secondary | ICD-10-CM | POA: Insufficient documentation

## 2013-05-26 DIAGNOSIS — R51 Headache: Secondary | ICD-10-CM | POA: Insufficient documentation

## 2013-05-26 DIAGNOSIS — R519 Headache, unspecified: Secondary | ICD-10-CM

## 2013-05-26 DIAGNOSIS — Z8719 Personal history of other diseases of the digestive system: Secondary | ICD-10-CM | POA: Insufficient documentation

## 2013-05-26 MED ORDER — DEXAMETHASONE SODIUM PHOSPHATE 10 MG/ML IJ SOLN
10.0000 mg | Freq: Once | INTRAMUSCULAR | Status: AC
  Administered 2013-05-26: 10 mg via INTRAVENOUS
  Filled 2013-05-26: qty 1

## 2013-05-26 MED ORDER — PROMETHAZINE HCL 25 MG/ML IJ SOLN
25.0000 mg | Freq: Once | INTRAMUSCULAR | Status: AC
  Administered 2013-05-26: 25 mg via INTRAVENOUS
  Filled 2013-05-26: qty 1

## 2013-05-26 MED ORDER — DIPHENHYDRAMINE HCL 50 MG/ML IJ SOLN
25.0000 mg | Freq: Once | INTRAMUSCULAR | Status: AC
  Administered 2013-05-26: 25 mg via INTRAVENOUS
  Filled 2013-05-26: qty 1

## 2013-05-26 NOTE — ED Notes (Signed)
The pt is c/o a headache for 3 days with n v.  He has headaches but none as severe as this

## 2013-05-26 NOTE — ED Provider Notes (Signed)
CSN: [id number]     Arrival date & time 05/26/13  1732 History   First MD Initiated Contact with Patient 05/26/13 2211     Chief Complaint  Patient presents with  . Headache   (Consider location/radiation/quality/duration/timing/severity/associated sxs/prior Treatment) HPI History provided by pt.   Pt presents w/ constant, throbbing, non-radiating, frontal headache for the past 3 days.  Gradual onset.  Associated w/ photo/phonophobia and N/V as well as self-limited episode of right hand paresthesias last night.  Denies fever, vision changes, dizziness, and extremities weakness.  No relief w/ ibuprofen or excedrin migraine.  Has similar sx of same degree of severity q1.5-2wks, but usually of less duration.  Denies head trauma. Past Medical History  Diagnosis Date  . Acute appendicitis without mention of peritonitis 07/02/2012   Past Surgical History  Procedure Laterality Date  . Laparoscopic appendectomy N/A 07/01/2012    Procedure: APPENDECTOMY LAPAROSCOPIC;  Surgeon: Lodema Pilot, DO;  Location: [facility];  Service: General;  Laterality: N/A;   Family History  Problem Relation Age of Onset  . Cancer Other    History  Substance Use Topics  . Smoking status: Current Every Day Smoker  . Smokeless tobacco: Not on file  . Alcohol Use: Yes     Comment: occ    Review of Systems  All other systems reviewed and are negative.    Allergies  Amoxicillin and Sulfa antibiotics  Home Medications   Current Outpatient Rx  Name  Route  Sig  Dispense  Refill  . acetaminophen (TYLENOL) 325 MG tablet   Oral   Take 650 mg by mouth every 6 (six) hours as needed for headache.         . carisoprodol (SOMA) 350 MG tablet   Oral   Take 350 mg by mouth 4 (four) times daily as needed for muscle spasms.         Marland Kitchen ibuprofen (ADVIL,MOTRIN) 800 MG tablet   Oral   Take 800 mg by mouth every 8 (eight) hours as needed for headache.          BP 134/75  Pulse 71  Temp(Src) 97.9 F (36.6 C)  (Oral)  Resp 16  Wt 187 lb (84.823 kg)  SpO2 98% Physical Exam  Nursing note and vitals reviewed. Constitutional: He is oriented to person, place, and time. He appears well-developed and well-nourished.  HENT:  Head: Normocephalic and atraumatic.  No tenderness of sinuses or temples.   Eyes:  Normal appearance  Neck: Normal range of motion. Neck supple. No rigidity. No Brudzinski's sign and no Kernig's sign noted.  Cardiovascular: Normal rate, regular rhythm and intact distal pulses.   Pulmonary/Chest: Effort normal and breath sounds normal.  Musculoskeletal: Normal range of motion.  Neurological: He is alert and oriented to person, place, and time. No sensory deficit. Coordination normal.  CN 3-12 intact.  No nystagmus.  5/5 and equal upper and lower extremity strength.  No past pointing.    Skin: Skin is warm and dry. No rash noted.  Psychiatric: He has a normal mood and affect. His behavior is normal.    ED Course  Procedures (including critical care time) Labs Review Labs Reviewed - No data to display Imaging Review No results found.  EKG Interpretation   None       MDM   1. Headache    22yo healthy M presents w/ non-traumatic headache x 3 days.  Has similar headaches of same degree of severity frequently, but they are usually of  shorter duration.  On exam, afebrile, non-toxic appearing, no focal neuro deficits or meningismus.  IV phenergan, benadryl and decadron ordered for sx.  Will reassess shortly.    Pain improved from 6 to 3-4/10.  Pt feel well enough to go home.  30mg  IV toradol ordered.  Referred to neurology d/t recurrent nature of sx.  Return precautions discussed.     Otilio Miuatherine E Jimmylee Ratterree, PA-C 05/27/13 [phone]

## 2013-05-27 MED ORDER — KETOROLAC TROMETHAMINE 30 MG/ML IJ SOLN
30.0000 mg | Freq: Once | INTRAMUSCULAR | Status: AC
  Administered 2013-05-27: 30 mg via INTRAVENOUS
  Filled 2013-05-27: qty 1

## 2013-05-27 NOTE — Discharge Instructions (Signed)
Take ibuprofen up to three times a day (or aleve up to twice a day) with food, as needed for headache pain.  Follow up with the neurologist.  You should return to the ER if your headache worsens or you develop associated fever, vision changes, dizziness, difficulty with speech, swallowing or walking, or numbness/ weakness of your arms or legs.

## 2013-05-28 NOTE — ED Provider Notes (Signed)
Medical screening examination/treatment/procedure(s) were performed by non-physician practitioner and as supervising physician I was immediately available for consultation/collaboration.  EKG Interpretation   None         Brandt LoosenJulie Candelaria Pies, MD 05/28/13 [phone]

## 2013-08-25 IMAGING — CT CT ABD-PELV W/ CM
1 of 2 series · 16 of 32 positions shown, 20 images · IV contrast (OMNIPAQUE 300)
Comparison: 04/19/2010

CLINICAL DATA: Right lower quadrant pain.

CT ABDOMEN AND PELVIS WITH CONTRAST
TECHNIQUE: Multidetector CT imaging of the abdomen and pelvis was
performed following the standard protocol during bolus
administration of intravenous contrast.
Contrast:  100 ml [id number].

[Series 2: abd/pel with · axial · 0.72mm/px · z∈[-426,+14]mm · 16 of 96 slices shown, 20 images]
[im 4/96  soft-tissue]
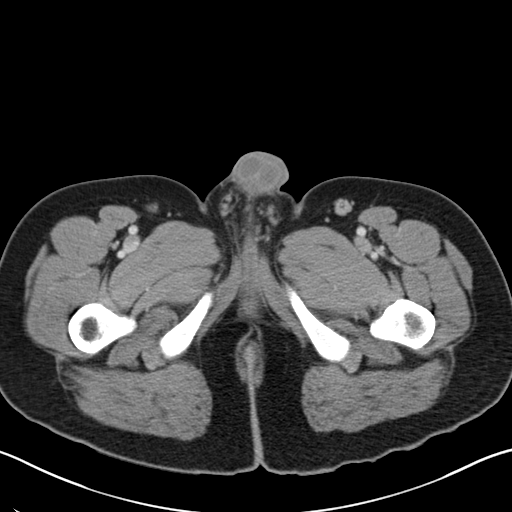
[im 4/96  bone]
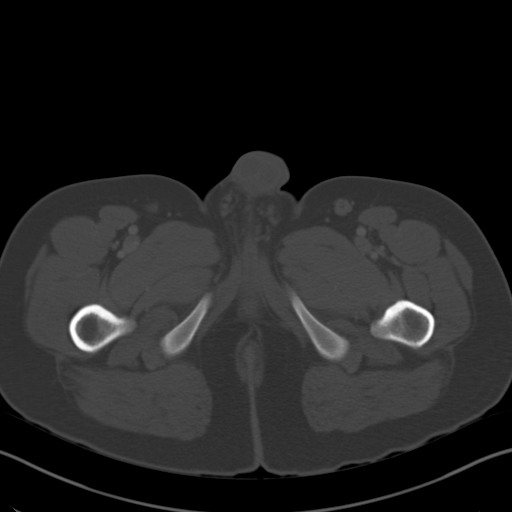
[im 12/96  soft-tissue]
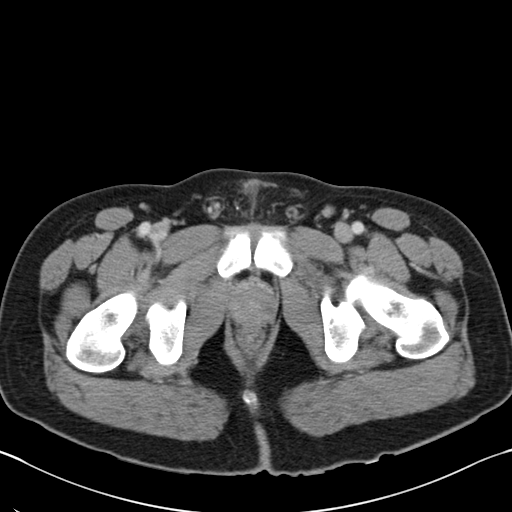
[im 20/96  soft-tissue]
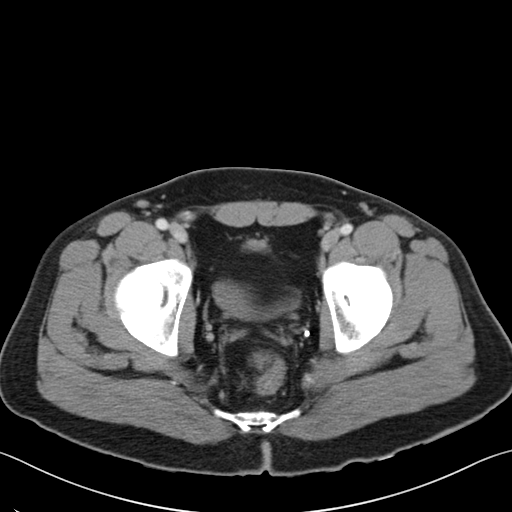
[im 24/96  soft-tissue]
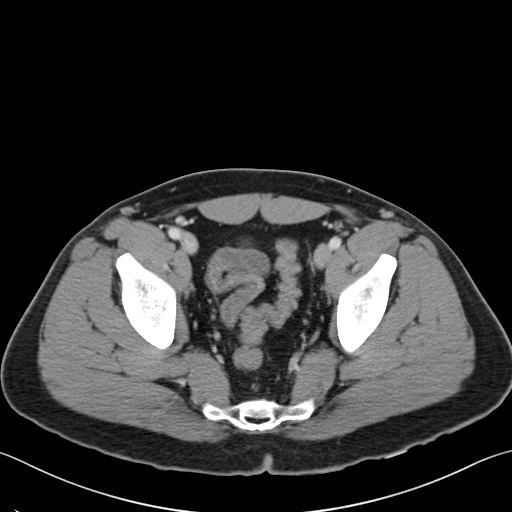
[im 32/96  soft-tissue]
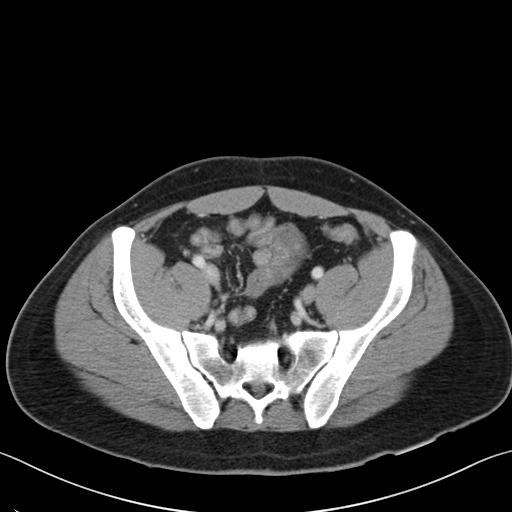
[im 40/96  soft-tissue]
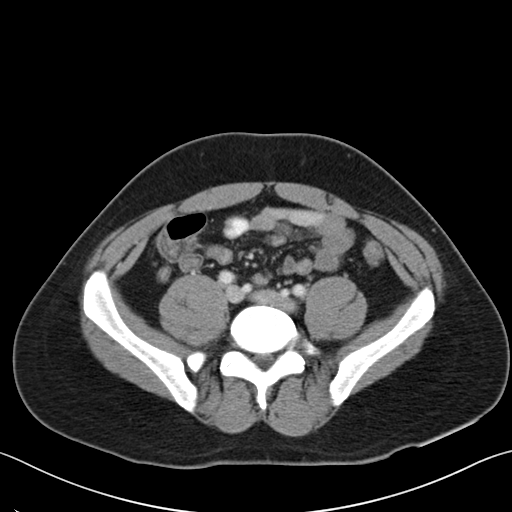
[im 44/96  soft-tissue]
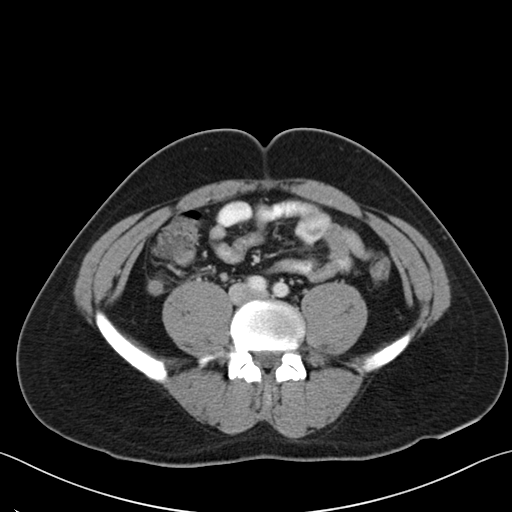
[im 52/96  soft-tissue]
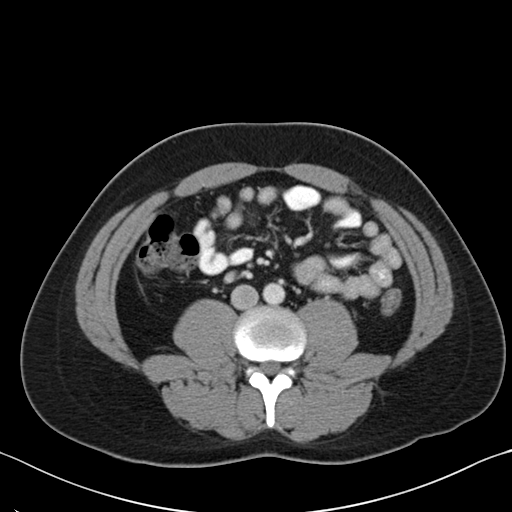
[im 56/96  soft-tissue]
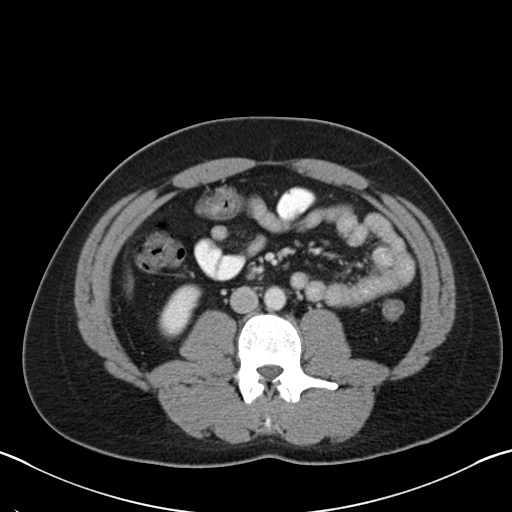
[im 56/96  bone]
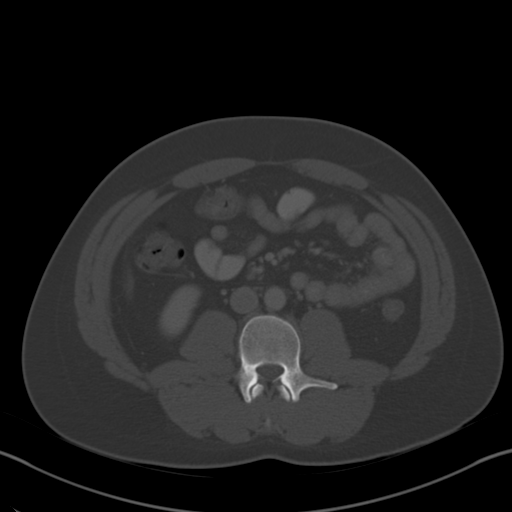
[im 64/96  soft-tissue]
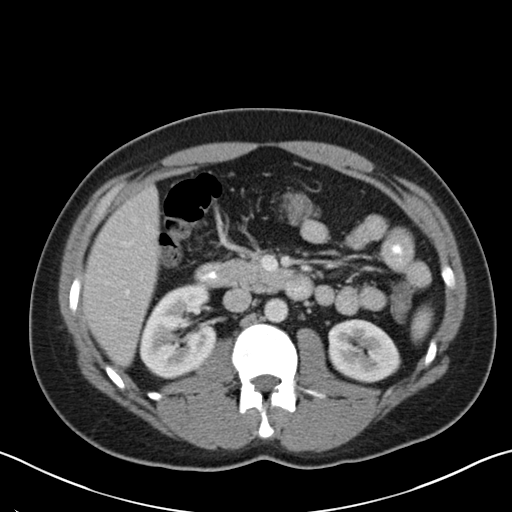
[im 72/96  soft-tissue]
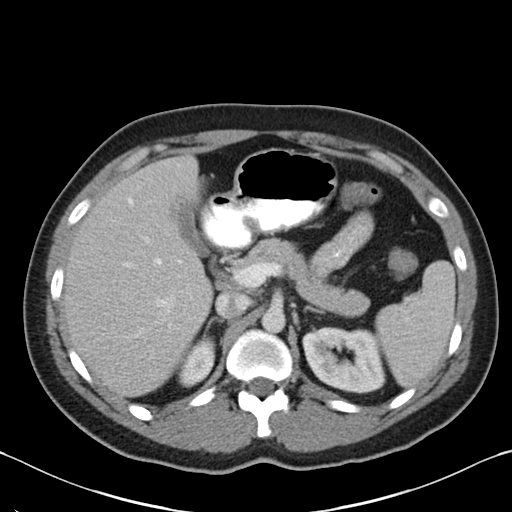
[im 76/96  soft-tissue]
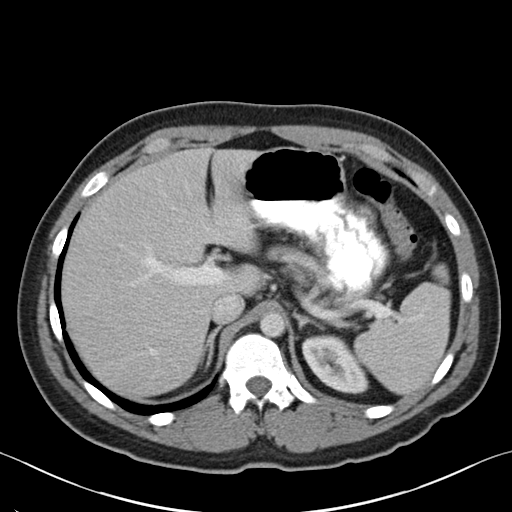
[im 80/96  lung]
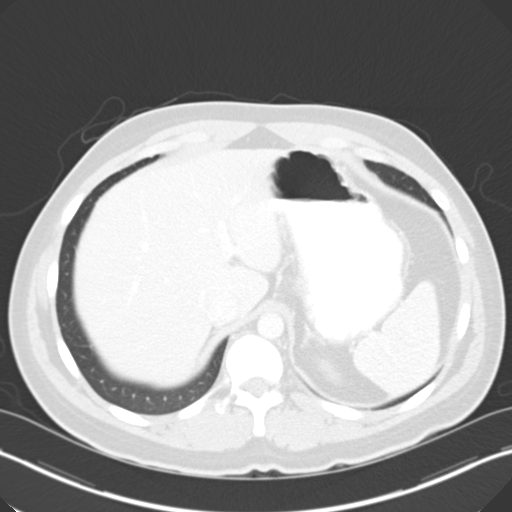
[im 84/96  soft-tissue]
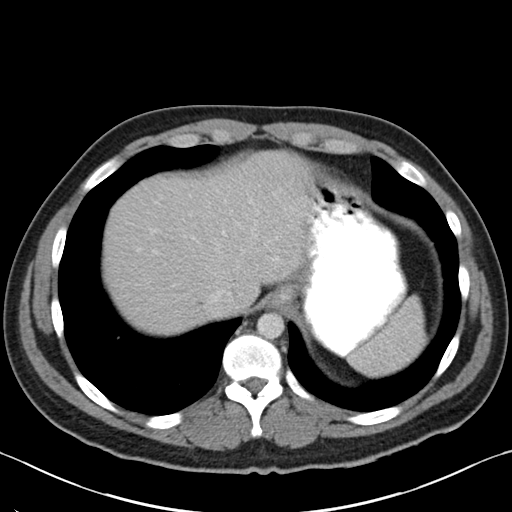
[im 84/96  lung]
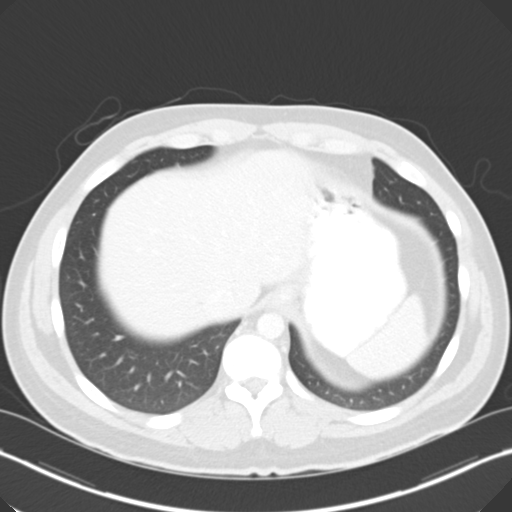
[im 88/96  lung]
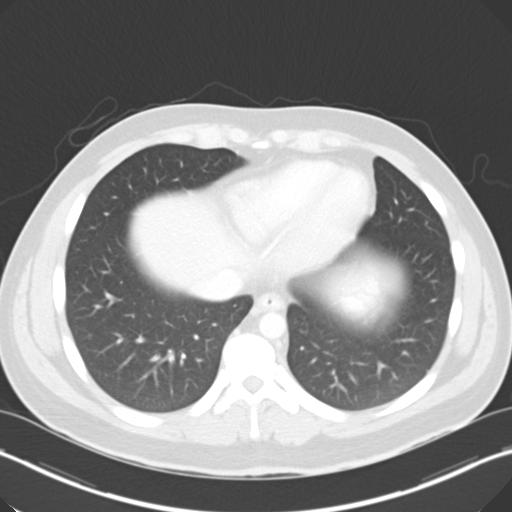
[im 92/96  soft-tissue]
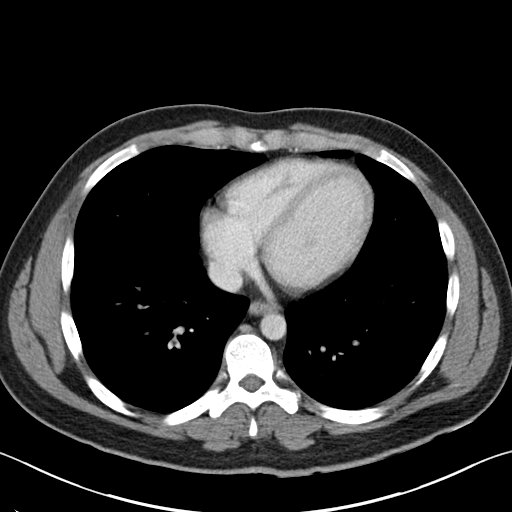
[im 92/96  lung]
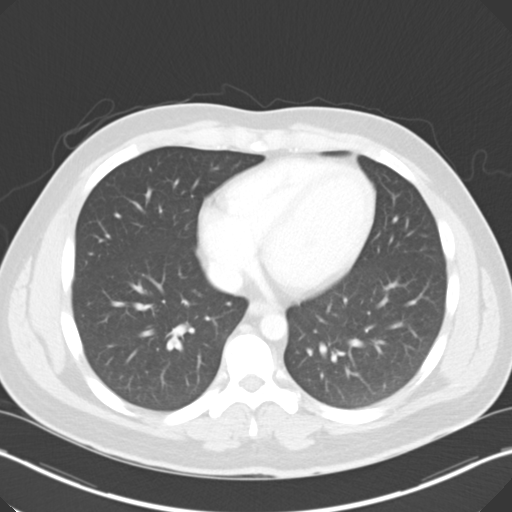

[16 of 32 positions shown; findings below may reference images not displayed]

FINDINGS: Lung bases are clear.  No effusions.  Heart is normal
size.  Liver, gallbladder, stomach, spleen, pancreas, adrenals and
kidneys are unremarkable.  Tiny low density area within the mid
pole of the right kidney compatible with small cysts, stable since
prior study.

There is enlargement of the retrocecal appendix with surrounding
inflammatory changes compatible with acute appendicitis.  No
evidence of perforation at this time.  Large and small bowel are
unremarkable.  Aorta and iliac vessels are normal caliber.

No acute bony abnormality.
IMPRESSION: Changes of acute appendicitis.  Appendix is retrocecal.

## 2013-09-06 ENCOUNTER — Encounter (HOSPITAL_COMMUNITY): Payer: Self-pay | Admitting: Emergency Medicine

## 2013-09-06 ENCOUNTER — Emergency Department (HOSPITAL_COMMUNITY)
Admission: EM | Admit: 2013-09-06 | Discharge: 2013-09-06 | Disposition: A | Discharge status: Home / Self Care | Payer: BC Managed Care – PPO | Attending: Emergency Medicine | Admitting: Emergency Medicine

## 2013-09-06 DIAGNOSIS — F17200 Nicotine dependence, unspecified, uncomplicated: Secondary | ICD-10-CM | POA: Insufficient documentation

## 2013-09-06 DIAGNOSIS — IMO0002 Reserved for concepts with insufficient information to code with codable children: Secondary | ICD-10-CM | POA: Insufficient documentation

## 2013-09-06 DIAGNOSIS — Z880 Allergy status to penicillin: Secondary | ICD-10-CM | POA: Insufficient documentation

## 2013-09-06 DIAGNOSIS — L0291 Cutaneous abscess, unspecified: Secondary | ICD-10-CM

## 2013-09-06 MED ORDER — CLINDAMYCIN HCL 300 MG PO CAPS: 300.0000 mg | ORAL_CAPSULE | Freq: Four times a day (QID) | ORAL | Status: DC

## 2013-09-06 MED ORDER — TRAMADOL HCL 50 MG PO TABS: 50.0000 mg | ORAL_TABLET | Freq: Four times a day (QID) | ORAL | Status: DC | PRN

## 2013-09-06 NOTE — ED Provider Notes (Signed)
CSN: [id number]     Arrival date & time 09/06/13  1526 History  This chart was scribed for non-physician practitioner, Junious SilkHannah Jerzi Tigert, PA-C working with Shon Batonourtney F Horton, MD by Greggory StallionKayla Andersen, ED scribe. This patient was seen in room WTR8/WTR8 and the patient's care was started at 4:45 PM.   Chief Complaint  Patient presents with  . Abscess   The history is provided by the patient. No language interpreter was used.   HPI Comments: Nathaniel Cabrera is a 23 y.o. male who presents to the Emergency Department complaining of an abscess to his left upper arm that started a few weeks ago. He states it was draining last week but it stopped and has worsened since that time. Pt has throbbing pain around the area. He has taken ibuprofen with no relief. Denies fever, nausea, emesis.   Past Medical History  Diagnosis Date  . Acute appendicitis without mention of peritonitis 07/02/2012   Past Surgical History  Procedure Laterality Date  . Laparoscopic appendectomy N/A 07/01/2012    Procedure: APPENDECTOMY LAPAROSCOPIC;  Surgeon: Lodema PilotBrian Layton, DO;  Location: [facility];  Service: General;  Laterality: N/A;   Family History  Problem Relation Age of Onset  . Cancer Other    History  Substance Use Topics  . Smoking status: Current Every Day Smoker  . Smokeless tobacco: Not on file  . Alcohol Use: Yes     Comment: occ    Review of Systems  Constitutional: Negative for fever.  Gastrointestinal: Negative for nausea and vomiting.  Skin:       Abscess.  All other systems reviewed and are negative.  Allergies  Amoxicillin and Sulfa antibiotics  Home Medications   Prior to Admission medications   Medication Sig Start Date End Date Taking? Authorizing Provider  acetaminophen (TYLENOL) 325 MG tablet Take 650 mg by mouth every 6 (six) hours as needed for headache.    Historical Provider, MD  carisoprodol (SOMA) 350 MG tablet Take 350 mg by mouth 4 (four) times daily as needed for muscle spasms.     Historical Provider, MD  ibuprofen (ADVIL,MOTRIN) 800 MG tablet Take 800 mg by mouth every 8 (eight) hours as needed for headache.    Historical Provider, MD   BP 143/71  Pulse 63  Temp(Src) 98.5 F (36.9 C) (Oral)  Resp 16  SpO2 100%  Physical Exam  Nursing note and vitals reviewed. Constitutional: He is oriented to person, place, and time. He appears well-developed and well-nourished. No distress.  HENT:  Head: Normocephalic and atraumatic.  Right Ear: External ear normal.  Left Ear: External ear normal.  Nose: Nose normal.  Eyes: Conjunctivae are normal.  Neck: Normal range of motion. No tracheal deviation present.  Cardiovascular: Normal rate, regular rhythm and normal heart sounds.   Pulmonary/Chest: Effort normal and breath sounds normal. No stridor.  Abdominal: Soft. He exhibits no distension. There is no tenderness.  Musculoskeletal: Normal range of motion.  Neurological: He is alert and oriented to person, place, and time.  Skin: Skin is warm and dry. He is not diaphoretic.     1.5 cm area of fluctuance with surrounding erythema to left upper arm.   Psychiatric: He has a normal mood and affect. His behavior is normal.    ED Course  Procedures (including critical care time)  DIAGNOSTIC STUDIES: Oxygen Saturation is 100% on RA, normal by my interpretation.    COORDINATION OF CARE: 4:46 PM-Discussed treatment plan which includes I&D and an antibiotic with pt at  bedside and pt agreed to plan.   INCISION AND DRAINAGE Performed by: Junious SilkHannah Tyrice Hewitt, PA-C Consent: Verbal consent obtained. Risks and benefits: risks, benefits and alternatives were discussed Type: abscess  Body area: left upper arm  Anesthesia: local infiltration  Incision was made with a scalpel.  Local anesthetic: lidocaine 2% with epinephrine  Anesthetic total: 1 ml  Complexity: complex Blunt dissection to break up loculations  Drainage: purulent  Drainage amount: copious  Packing  material: none  Patient tolerance: Patient tolerated the procedure well with no immediate complications.  Labs Review Labs Reviewed - No data to display  Imaging Review No results found.   EKG Interpretation None      MDM   Final diagnoses:  Abscess   Patient with skin abscess amenable to incision and drainage.  Abscess was not large enough to warrant packing or drain,  wound recheck in 2 days. Encouraged home warm soaks and flushing.  Signs of cellulitis is surrounding skin.  Given history of MRSA and surrounding erythema will discharge home with clindamycin.     I personally performed the services described in this documentation, which was scribed in my presence. The recorded information has been reviewed and is accurate.  Mora BellmanHannah S Garik Diamant, PA-C 09/06/13 2046

## 2013-09-06 NOTE — Discharge Instructions (Signed)
Abscess °An abscess (boil or furuncle) is an infected area on or under the skin. This area is filled with yellowish-white fluid (pus) and other material (debris). °HOME CARE  °· Only take medicines as told by your doctor. °· If you were given antibiotic medicine, take it as directed. Finish the medicine even if you start to feel better. °· If gauze is used, follow your doctor's directions for changing the gauze. °· To avoid spreading the infection: °· Keep your abscess covered with a bandage. °· Wash your hands well. °· Do not share personal care items, towels, or whirlpools with others. °· Avoid skin contact with others. °· Keep your skin and clothes clean around the abscess. °· Keep all doctor visits as told. °GET HELP RIGHT AWAY IF:  °· You have more pain, puffiness (swelling), or redness in the wound site. °· You have more fluid or blood coming from the wound site. °· You have muscle aches, chills, or you feel sick. °· You have a fever. °MAKE SURE YOU:  °· Understand these instructions. °· Will watch your condition. °· Will get help right away if you are not doing well or get worse. °Document Released: 10/09/2007 Document Revised: 10/22/2011 Document Reviewed: 07/05/2011 °ExitCare® Patient Information ©2014 [employer]. ° °Cellulitis °Cellulitis is an infection of the skin and the tissue under the skin. The infected area is usually red and tender. This happens most often in the arms and lower legs. °HOME CARE  °· Take your antibiotic medicine as told. Finish the medicine even if you start to feel better. °· Keep the infected arm or leg raised (elevated). °· Put a warm cloth on the area up to 4 times per day. °· Only take medicines as told by your doctor. °· Keep all doctor visits as told. °GET HELP RIGHT AWAY IF:  °· You have a fever. °· You feel very sleepy. °· You throw up (vomit) or have watery poop (diarrhea). °· You feel sick and have muscle aches and pains. °· You see red streaks on the skin coming from  the infected area. °· Your red area gets bigger or turns a dark color. °· Your bone or joint under the infected area is painful after the skin heals. °· Your infection comes back in the same area or different area. °· You have a puffy (swollen) bump in the infected area. °· You have new symptoms. °MAKE SURE YOU:  °· Understand these instructions. °· Will watch your condition. °· Will get help right away if you are not doing well or get worse. °Document Released: 10/09/2007 Document Revised: 10/22/2011 Document Reviewed: 07/08/2011 °ExitCare® Patient Information ©2014 [employer]. ° °

## 2013-09-06 NOTE — ED Notes (Signed)
Pt has abscess under L armpit. Pt had it drained a week ago, but when wound stopped draining it began to swell again and hurt.

## 2013-09-08 NOTE — ED Provider Notes (Signed)
Medical screening examination/treatment/procedure(s) were performed by non-physician practitioner and as supervising physician I was immediately available for consultation/collaboration.   EKG Interpretation None       Merrick Feutz F Mirtha Jain, MD 09/08/13 0110 

## 2013-09-21 ENCOUNTER — Encounter (HOSPITAL_COMMUNITY): Payer: Self-pay | Admitting: Emergency Medicine

## 2013-09-21 ENCOUNTER — Emergency Department (HOSPITAL_COMMUNITY)
Admission: EM | Admit: 2013-09-21 | Discharge: 2013-09-21 | Disposition: A | Discharge status: Home / Self Care | Payer: BC Managed Care – PPO | Attending: Emergency Medicine | Admitting: Emergency Medicine

## 2013-09-21 DIAGNOSIS — F17200 Nicotine dependence, unspecified, uncomplicated: Secondary | ICD-10-CM | POA: Insufficient documentation

## 2013-09-21 DIAGNOSIS — Z9889 Other specified postprocedural states: Secondary | ICD-10-CM | POA: Insufficient documentation

## 2013-09-21 DIAGNOSIS — L678 Other hair color and hair shaft abnormalities: Secondary | ICD-10-CM | POA: Insufficient documentation

## 2013-09-21 DIAGNOSIS — L738 Other specified follicular disorders: Secondary | ICD-10-CM | POA: Insufficient documentation

## 2013-09-21 DIAGNOSIS — R109 Unspecified abdominal pain: Secondary | ICD-10-CM | POA: Insufficient documentation

## 2013-09-21 DIAGNOSIS — L739 Follicular disorder, unspecified: Secondary | ICD-10-CM

## 2013-09-21 DIAGNOSIS — Z880 Allergy status to penicillin: Secondary | ICD-10-CM | POA: Insufficient documentation

## 2013-09-21 DIAGNOSIS — R509 Fever, unspecified: Secondary | ICD-10-CM | POA: Insufficient documentation

## 2013-09-21 DIAGNOSIS — R197 Diarrhea, unspecified: Secondary | ICD-10-CM | POA: Insufficient documentation

## 2013-09-21 DIAGNOSIS — Z8719 Personal history of other diseases of the digestive system: Secondary | ICD-10-CM | POA: Insufficient documentation

## 2013-09-21 DIAGNOSIS — R112 Nausea with vomiting, unspecified: Secondary | ICD-10-CM

## 2013-09-21 LAB — I-STAT CHEM 8, ED
BUN: 5 mg/dL — ABNORMAL LOW (ref 6–23)
CHLORIDE: 101 meq/L (ref 96–112)
Calcium, Ion: 1.2 mmol/L (ref 1.12–1.23)
Creatinine, Ser: 1 mg/dL (ref 0.50–1.35)
Glucose, Bld: 81 mg/dL (ref 70–99)
HEMATOCRIT: 52 % (ref 39.0–52.0)
Hemoglobin: 17.7 g/dL — ABNORMAL HIGH (ref 13.0–17.0)
Potassium: 3.8 mEq/L (ref 3.7–5.3)
Sodium: 141 mEq/L (ref 137–147)
TCO2: 26 mmol/L (ref 0–100)

## 2013-09-21 LAB — I-STAT CG4 LACTIC ACID, ED: LACTIC ACID, VENOUS: 1.2 mmol/L (ref 0.5–2.2)

## 2013-09-21 MED ORDER — SODIUM CHLORIDE 0.9 % IV BOLUS (SEPSIS)
1000.0000 mL | Freq: Once | INTRAVENOUS | Status: AC
  Administered 2013-09-21: 1000 mL via INTRAVENOUS

## 2013-09-21 MED ORDER — ONDANSETRON HCL 4 MG/2ML IJ SOLN
4.0000 mg | Freq: Once | INTRAMUSCULAR | Status: AC
  Administered 2013-09-21: 4 mg via INTRAVENOUS
  Filled 2013-09-21: qty 2

## 2013-09-21 MED ORDER — ONDANSETRON 4 MG PO TBDP: ORAL_TABLET | ORAL | Status: DC

## 2013-09-21 NOTE — ED Notes (Signed)
Pt c/o v/d since 0200 yesterday morning and rashes under arms that started this morning that is spreading to back. Pt also states that he feels weak and has a fever.

## 2013-09-21 NOTE — ED Notes (Signed)
Pt given Ginger Ale for PO challenge. 

## 2013-09-21 NOTE — ED Provider Notes (Signed)
CSN: [id number]     Arrival date & time 09/21/13  1705 History   First MD Initiated Contact with Patient 09/21/13 1717     Chief Complaint  Patient presents with  . Emesis  . Diarrhea  . Rash    under armS     (Consider location/radiation/quality/duration/timing/severity/associated sxs/prior Treatment) Patient is a 23 y.o. male presenting with vomiting, diarrhea, and rash. The history is provided by the patient.  Emesis Severity:  Moderate Duration:  2 days Timing:  Intermittent Quality:  Stomach contents Feeding tolerance: nothing. Progression:  Improving Chronicity:  New Recent urination:  Normal Relieved by:  Nothing Worsened by:  Nothing tried Associated symptoms: abdominal pain, diarrhea and fever (101 yesterday)   Associated symptoms: no chills, no cough, no sore throat and no URI   Diarrhea:    Quality:  Watery   Severity:  Moderate   Duration:  1 day   Timing:  Constant   Progression:  Unchanged Diarrhea Associated symptoms: abdominal pain and vomiting   Associated symptoms: no chills, no recent cough and no URI   Rash Associated symptoms: abdominal pain, diarrhea and vomiting   Associated symptoms: no sore throat     Past Medical History  Diagnosis Date  . Acute appendicitis without mention of peritonitis 07/02/2012   Past Surgical History  Procedure Laterality Date  . Laparoscopic appendectomy N/A 07/01/2012    Procedure: APPENDECTOMY LAPAROSCOPIC;  Surgeon: Lodema PilotBrian Layton, DO;  Location: [facility];  Service: General;  Laterality: N/A;   Family History  Problem Relation Age of Onset  . Cancer Other    History  Substance Use Topics  . Smoking status: Current Every Day Smoker  . Smokeless tobacco: Not on file  . Alcohol Use: Yes     Comment: occ    Review of Systems  Constitutional: Negative for chills.  HENT: Negative for sore throat.   Gastrointestinal: Positive for vomiting, abdominal pain and diarrhea.  Skin: Positive for rash.  All other  systems reviewed and are negative.     Allergies  Amoxicillin and Sulfa antibiotics  Home Medications   Prior to Admission medications   Medication Sig Start Date End Date Taking? Authorizing Provider  Aspirin-Salicylamide-Caffeine (BC HEADACHE POWDER PO) Take 1 packet by mouth as needed (headache).   Yes Historical Provider, MD  Chlorphen-Pseudoephed-APAP (THERAFLU FLU/COLD PO) Take 1 packet by mouth every 4 (four) hours as needed (cold symptons).   Yes Historical Provider, MD  clindamycin (CLEOCIN) 300 MG capsule Take 1 capsule (300 mg total) by mouth 4 (four) times daily. X 7 days 09/06/13   Mora BellmanHannah S Merrell, PA-C   BP 134/83  Pulse 72  Temp(Src) 98.2 F (36.8 C) (Oral)  Resp 20  SpO2 99% Physical Exam  Vitals reviewed. Constitutional: He is oriented to person, place, and time. He appears well-developed and well-nourished. No distress.  HENT:  Head: Normocephalic and atraumatic.  Mouth/Throat: No oropharyngeal exudate.  Eyes: EOM are normal. Pupils are equal, round, and reactive to light.  Neck: Normal range of motion. Neck supple.  Cardiovascular: Normal rate and regular rhythm.  Exam reveals no friction rub.   No murmur heard. Pulmonary/Chest: Effort normal and breath sounds normal. No respiratory distress. He has no wheezes. He has no rales.  Abdominal: Soft. He exhibits no distension. There is no tenderness. There is no rebound.  Musculoskeletal: Normal range of motion. He exhibits no edema.  Neurological: He is alert and oriented to person, place, and time. He exhibits normal muscle tone.  Skin: He is not diaphoretic.  Folliculitis on bilateral axilla. No pustules, no erythema, no cellulitis.    ED Course  Procedures (including critical care time) Labs Review Labs Reviewed  I-STAT CHEM 8, ED - Abnormal; Notable for the following:    BUN 5 (*)    Hemoglobin 17.7 (*)    All other components within normal limits  I-STAT CG4 LACTIC ACID, ED    Imaging Review No  results found.   EKG Interpretation None      MDM   Final diagnoses:  Nausea vomiting and diarrhea  Folliculitis    81M here with nausea, vomiting, diarrhea, fever. Fever yesterday morning, none today. Vomiting 5-6 times yesterday, multiple times today. Vomiting improving, but diarrhea starting today. No blood in either. No abdominal pain. No sick contacts. This began [place] 2 days ago.  Also has rash on bilateral underarms. No cellulitis, no abscess. Previous abscess on L medial humerus - healing well. Rash c/w folliculitis. No active cellulitis. No palmar or sole of foot lesions. Has sunburn rash on bilateral shoulders - states he was out in the sun, no concern for toxic-shock. Vitals stable. Belly benign. Will hydrate, check lactate and lytes. Labs ok. Tolerating PO. Stable for discharge, clinical picture likely viral gastroenteritis. Given zofran.   Dagmar HaitWilliam Shakendra Griffeth, MD 09/21/13 2225

## 2013-09-21 NOTE — Discharge Instructions (Signed)
Nausea and Vomiting °Nausea is a sick feeling that often comes before throwing up (vomiting). Vomiting is a reflex where stomach contents come out of your mouth. Vomiting can cause severe loss of body fluids (dehydration). Children and elderly adults can become dehydrated quickly, especially if they also have diarrhea. Nausea and vomiting are symptoms of a condition or disease. It is important to find the cause of your symptoms. °CAUSES  °· Direct irritation of the stomach lining. This irritation can result from increased acid production (gastroesophageal reflux disease), infection, food poisoning, taking certain medicines (such as nonsteroidal anti-inflammatory drugs), alcohol use, or tobacco use. °· Signals from the brain. These signals could be caused by a headache, heat exposure, an inner ear disturbance, increased pressure in the brain from injury, infection, a tumor, or a concussion, pain, emotional stimulus, or metabolic problems. °· An obstruction in the gastrointestinal tract (bowel obstruction). °· Illnesses such as diabetes, hepatitis, gallbladder problems, appendicitis, kidney problems, cancer, sepsis, atypical symptoms of a heart attack, or eating disorders. °· Medical treatments such as chemotherapy and radiation. °· Receiving medicine that makes you sleep (general anesthetic) during surgery. °DIAGNOSIS °Your caregiver may ask for tests to be done if the problems do not improve after a few days. Tests may also be done if symptoms are severe or if the reason for the nausea and vomiting is not clear. Tests may include: °· Urine tests. °· Blood tests. °· Stool tests. °· Cultures (to look for evidence of infection). °· X-rays or other imaging studies. °Test results can help your caregiver make decisions about treatment or the need for additional tests. °TREATMENT °You need to stay well hydrated. Drink frequently but in small amounts. You may wish to drink water, sports drinks, clear broth, or eat frozen  ice pops or gelatin dessert to help stay hydrated. When you eat, eating slowly may help prevent nausea. There are also some antinausea medicines that may help prevent nausea. °HOME CARE INSTRUCTIONS  °· Take all medicine as directed by your caregiver. °· If you do not have an appetite, do not force yourself to eat. However, you must continue to drink fluids. °· If you have an appetite, eat a normal diet unless your caregiver tells you differently. °· Eat a variety of complex carbohydrates (rice, wheat, potatoes, bread), lean meats, yogurt, fruits, and vegetables. °· Avoid high-fat foods because they are more difficult to digest. °· Drink enough water and fluids to keep your urine clear or pale yellow. °· If you are dehydrated, ask your caregiver for specific rehydration instructions. Signs of dehydration may include: °· Severe thirst. °· Dry lips and mouth. °· Dizziness. °· Dark urine. °· Decreasing urine frequency and amount. °· Confusion. °· Rapid breathing or pulse. °SEEK IMMEDIATE MEDICAL CARE IF:  °· You have blood or brown flecks (like coffee grounds) in your vomit. °· You have black or bloody stools. °· You have a severe headache or stiff neck. °· You are confused. °· You have severe abdominal pain. °· You have chest pain or trouble breathing. °· You do not urinate at least once every 8 hours. °· You develop cold or clammy skin. °· You continue to vomit for longer than 24 to 48 hours. °· You have a fever. °MAKE SURE YOU:  °· Understand these instructions. °· Will watch your condition. °· Will get help right away if you are not doing well or get worse. °Document Released: 04/22/2005 Document Revised: 07/15/2011 Document Reviewed: 09/19/2010 °ExitCare® Patient Information ©2014 [employer]. ° ° °Emergency Department Resource   Guide °1) Find a Doctor and Pay Out of Pocket °Although you won't have to find out who is covered by your insurance plan, it is a good idea to ask around and get recommendations. You will  then need to call the office and see if the doctor you have chosen will accept you as a new patient and what types of options they offer for patients who are self-pay. Some doctors offer discounts or will set up payment plans for their patients who do not have insurance, but you will need to ask so you aren't surprised when you get to your appointment. ° °2) Contact Your Local Health Department °Not all health departments have doctors that can see patients for sick visits, but many do, so it is worth a call to see if yours does. If you don't know where your local health department is, you can check in your phone book. The CDC also has a tool to help you locate your state's health department, and many state websites also have listings of all of their local health departments. ° °3) Find a Walk-in Clinic °If your illness is not likely to be very severe or complicated, you may want to try a walk in clinic. These are popping up all over the country in pharmacies, drugstores, and shopping centers. They're usually staffed by nurse practitioners or physician assistants that have been trained to treat common illnesses and complaints. They're usually fairly quick and inexpensive. However, if you have serious medical issues or chronic medical problems, these are probably not your best option. ° °No Primary Care Doctor: °- Call [facility] at  832-8000 - they can help you locate a primary care doctor that  accepts your insurance, provides certain services, etc. °- Physician Referral Service- 1-800-533-3463 ° °Chronic Pain Problems: °Organization         Address  Phone   Notes  ° [facility]  (336) 297-2271 Patients need to be referred by their primary care doctor.  ° °Medication Assistance: °Organization         Address  Phone   Notes  °[place] [facility] [address] °[place], [place] [address] (336) 641-8030 --Must be a resident of [place] °-- Must have NO  insurance coverage whatsoever (no Medicaid/ Medicare, etc.) °-- The pt. MUST have a primary care doctor that directs their care regularly and follows them in the community °  °[facility]  (866) 331-1348   °[employer]  (888) 892-1162   ° °Agencies that provide inexpensive medical care: °Organization         Address  Phone   Notes  °[facility]  (336) 832-8035   °[facility]    (336) 832-7272   °[facility] [address] °[place], [place] [address] (336) 832-4777   °[facility] [address], °[place] (336) 271-4999   °[facility]    (336) 373-0678   °[facility]    (336) 272-1050   °[facility] ° [address], [place] Phone:  (336) 832-4444, Fax:  (336) 832-4440 Hours of Operation:  9 am - 6 pm, M-F.  Also accepts Medicaid/Medicare and self-pay.  °[facility] ° [address], [place] Phone: (336) 832-3150, Fax: (336) 832-3151. Hours of Operation:  8:30 am - 5:30 pm, M-F.  Also accepts Medicaid and self-pay.  °[facility] [place] [address],   [place] Phone: (336) 878-6027   °[facility] [address], [place], [place] (336)723-1848, Ext. 123 Mondays & Thursdays: 7-9 AM.  First 15 patients are seen on a first come, first serve basis. °  ° °Medicaid-accepting [place] Providers: ° °Organization         Address  Phone   Notes  °[facility] [address], [place] (336) 641-2100 Also accepts self-pay patients.  °[facility] [address], [place] ° (336) 856-9996   °[facility] [address], [place] (336) 288-8857   °[facility] [address] [place] [address], [place] (336) 299-7000   °Veita Bland [address], [place]  ° (336) 373-1557 Only accepts [place] Access Medicaid patients after they have  their name applied to their card.  ° °Self-Pay (no insurance) in [place]: ° °Organization         Address  Phone   Notes  °Sickle Cell Patients, [facility] [address], [place] (336) 832-1970   °[facility] [address], [place] (336) 832-4400   ° [facility] ° [address], [place] (336) 992-4800   °[facility]/Dr. Osei-Bonsu ° [address] [place] [address], [place] or [address], [place] (336) 841-8500 Phone number for both [place] and [place] locations is the same.  °[facility] [address], [place] (336) 299-0000   °[facility] [address] [place] [address], [place] or [address] (336) 852-7530 °(336) 878-2260   °[facility] [address], [place] (336) 350-1642, phone; (336) 294-5005, fax Sees patients 1st and 3rd Saturday of every month.  Must not qualify for public or private insurance (i.e. Medicaid, Medicare, [place] Health Choice, Veterans' Benefits) • Household income should be no more than 200% of the poverty level •The clinic cannot treat you if you are pregnant or think you are pregnant • Sexually transmitted diseases are not treated at the clinic.  ° ° °Dental Care: °Organization         Address  Phone  Notes  °[facility] [address],  (336) 641-6152 Accepts children up to age 21 who are enrolled in Medicaid or [place] Health Choice; pregnant women with a Medicaid card; and children who have applied for Medicaid or [place] Health Choice, but were declined, whose parents can pay a reduced fee at time of service.  °[facility] [place]  [address], [place] (336) 641-7733 Accepts children up to age 21 who are enrolled in Medicaid or [place] Health Choice; pregnant women with a Medicaid card; and children who have applied  for Medicaid or [place] Health Choice, but were declined, whose parents can pay a reduced fee at time of service.  °[facility] ° [address],  (336) 641-4533 Patients are seen by appointment only. Walk-ins are not accepted. [facility] will see patients 18 years of age and older. °Monday - Tuesday (8am-5pm) °Most Wednesdays (8:30-5pm) °$30 per visit, cash only  °[facility] ° [address], [place] (336) 641-4533 Patients are seen by appointment only. Walk-ins are not accepted. [facility] will see patients 18 years of age and older. °One Wednesday Evening (  Monthly: Volunteer Based).  $30 per visit, cash only  °[facility]  (919) 537-3737 for adults; Children under age 4, call Graduate Pediatric Dentistry at (919) [phone]. Children aged 4-14, please call (919) [phone] to request a pediatric application. ° Dental services are provided in all areas of dental care including fillings, crowns and bridges, complete and partial dentures, implants, gum treatment, root canals, and extractions. Preventive care is also provided. Treatment is provided to both adults and children. °Patients are selected via a lottery and there is often a waiting list. °  °[facility] [address], °[place] ° (336) 763-8833 [email or web address] °  °[facility] [address], [place], [place] (336)723-1848, Ext. 123 Second and Fourth Thursday of each month, opens at 6:30 AM; Clinic ends at 9 AM.  Patients are seen on a first-come first-served basis, and a limited number are seen during each clinic.  ° °[facility] ° [address], [place], [place] (336) 723-7904   Eligibility Requirements °You must have lived in [place], [place], or [place] counties for at least the last three months. °  You cannot be eligible for state or federal sponsored healthcare insurance, including Veterans Administration,  Medicaid, or Medicare. °  You generally cannot be eligible for healthcare insurance through your employer.  °  How to apply: °Eligibility screenings are held every Tuesday and Wednesday afternoon from 1:00 pm until 4:00 pm. You do not need an appointment for the interview!  °[facility] [address], [place], [place] 336-631-2330   °[facility]  336-342-8273   °[facility]  336-703-3100   °[facility]  336-570-6415   ° °Behavioral Health Resources in the Community: °Intensive Outpatient Programs °Organization         Address  Phone  Notes  °[facility] [address], [place], [place] 336-878-6098   °[facility] [address], [place], [place] 336-832-9800   °[facility] [address], [place], [place] ° 336-882-2125   °[facility] [address],  °[place], [place] 1-800-853-5163 or 336-641-4981   °Substance Abuse Resources °Organization         Address  Phone  Notes  °[facility]  336-882-2125   °[facility]  336-784-9470   °[facility]  336-285-9073   °[facility]  336-845-3988   °Residential & Outpatient Substance Abuse Program  1-800-659-3381   °Psychological Services °Organization         Address  Phone  Notes  °[facility]  336- 832-9600   °[facility]  336- 378-7881   °[facility] [address], [place] 1-800-853-5163 or 336-641-4981   ° °Mobile Crisis Teams °Organization         Address  Phone  Notes  °[facility], Mobile Crisis Care Unit  1-877-626-1772   °Assertive °[facility] ° [address] [place], [place] 336-834-9664   °Sharon DeEsch [address] °[place] 336-554-5454   ° °Self-Help/Support Groups °Organization         Address  Phone             Notes  °[facility]  - variety of support  groups  336- 373-1402 Call for more information  °Narcotics Anonymous (NA), [facility] [address], °[place]   2 meetings at this location  ° °  Residential Treatment Programs °Organization         Address  Phone  Notes  °[facility] [address],    °[place]  1-866-801-8205   °[facility] ° [address], [place], [place] 704-293-8524   °[facility] [address], [place] 336-845-3988 Admissions: 8am-3pm M-F  °[facility] [address]    °[place], [place] 336-841-1104   °[facility] [address], [place], [place] 336-379-7146   °[facility] [address]  °[place], [place] 336-285-9073   °[facility] - Intensive Outpatient [address], [place], [place] 336-852-3033   °[facility] [address]  °[place], [place] 1-877-615-2722 or 336-784-9470   °[facility] [address] [place], [place] 336-227-7417 Accepts Medicaid  °[facility] [address]  °[place] 1-800-659-3381 Substance Abuse/Addiction Treatment  ° °[place] Behavioral Health Resources °Organization         Address  Phone  Notes  °[facility]  (888) 581-9988   °Julie Brannon, PhD [address] [place], [place]   (336) 349-5553 or (336) 951-0000   °[facility]   [address] °[place], [place] (336) 349-4454   °[facility] [address], [place], [place] (336) 342-8316 Insurance/Medicaid/sponsorship through [facility]  °[facility] [address]                                    [place], [place] (336) 342-8316 Therapy/tele-psych/case  °[facility] [address]  ° [place], [place] (336) 349-2233    °Dr. Arfeen  (336) 349-4544   °[facility] [place]  [employer] [place] [facility] 1) [address], [place] °2) [address], [place] °3)   [address], [place] (336) 349-3220 °(336) 342-7768 ° °(336) 342-8140   °[place] [facility] (336) 342-1394 or (336) 342-3537 (After Hours)    ° ° ° °

## 2016-03-01 ENCOUNTER — Ambulatory Visit (HOSPITAL_COMMUNITY)
Admission: EM | Admit: 2016-03-01 | Discharge: 2016-03-01 | Disposition: A | Discharge status: Home / Self Care | Payer: BLUE CROSS/BLUE SHIELD | Attending: Internal Medicine | Admitting: Internal Medicine

## 2016-03-01 ENCOUNTER — Encounter (HOSPITAL_COMMUNITY): Payer: Self-pay | Admitting: Family Medicine

## 2016-03-01 DIAGNOSIS — J029 Acute pharyngitis, unspecified: Secondary | ICD-10-CM | POA: Diagnosis not present

## 2016-03-01 LAB — POCT RAPID STREP A: Streptococcus, Group A Screen (Direct): NEGATIVE

## 2016-03-01 MED ORDER — DEXAMETHASONE SODIUM PHOSPHATE 10 MG/ML IJ SOLN
INTRAMUSCULAR | Status: AC
  Filled 2016-03-01: qty 1

## 2016-03-01 MED ORDER — DEXAMETHASONE SODIUM PHOSPHATE 10 MG/ML IJ SOLN
10.0000 mg | Freq: Once | INTRAMUSCULAR | Status: AC
  Administered 2016-03-01: 10 mg via INTRAMUSCULAR

## 2016-03-01 NOTE — Discharge Instructions (Signed)
Salt water gargles  Chloraseptic spray or lozenges  Steroid injection should help pain resolve in the next 6-12 hours.

## 2016-03-01 NOTE — ED Triage Notes (Signed)
Pt here for sore throat, fever and body weakness. sts since Thursday.

## 2016-03-01 NOTE — ED Provider Notes (Signed)
CSN: [id number]     Arrival date & time 03/01/16  1035 History   First MD Initiated Contact with Patient 03/01/16 1157     Chief Complaint  Patient presents with  . Sore Throat   (Consider location/radiation/quality/duration/timing/severity/associated sxs/prior Treatment) HPI NP 25 Y/O MALE WITH ST SINCE Thursday. NO HOME TREATMENT, SOME FEVER, BODY ACHES AND WEAKNESS. PAIN TO SWALLOW.  Past Medical History:  Diagnosis Date  . Acute appendicitis without mention of peritonitis 07/02/2012   Past Surgical History:  Procedure Laterality Date  . LAPAROSCOPIC APPENDECTOMY N/A 07/01/2012   Procedure: APPENDECTOMY LAPAROSCOPIC;  Surgeon: Lodema PilotBrian Layton, DO;  Location: [facility];  Service: General;  Laterality: N/A;   Family History  Problem Relation Age of Onset  . Cancer Other    Social History  Substance Use Topics  . Smoking status: Current Every Day Smoker  . Smokeless tobacco: Never Used  . Alcohol use Yes     Comment: occ    Review of Systems  Denies: HEADACHE, NAUSEA, ABDOMINAL PAIN, CHEST PAIN, CONGESTION, DYSURIA, SHORTNESS OF BREATH  Allergies  Amoxicillin and Sulfa antibiotics  Home Medications   Prior to Admission medications   Not on File   Meds Ordered and Administered this Visit   Medications  dexamethasone (DECADRON) injection 10 mg (10 mg Intramuscular Given 03/01/16 1229)    BP 145/89   Pulse 74   Temp 98.8 F (37.1 C)   Resp 18   SpO2 98%  No data found.   Physical Exam NURSES NOTES AND VITAL SIGNS REVIEWED. CONSTITUTIONAL: Well developed, well nourished, no acute distress HEENT: normocephalic, atraumatic, THROAT WITHOUT INJECTION OR EXUDATE EYES: Conjunctiva normal NECK:normal ROM, supple, no adenopathy PULMONARY:No respiratory distress, normal effort ABDOMINAL: Soft, ND, NT BS+, No CVAT MUSCULOSKELETAL: Normal ROM of all extremities,  SKIN: warm and dry without rash PSYCHIATRIC: Mood and affect, behavior are normal  Urgent Care Course    Clinical Course    Procedures (including critical care time)  Labs Review Labs Reviewed  CULTURE, GROUP A STREP [facility]  POCT RAPID STREP A    Imaging Review No results found.   Visual Acuity Review  Right Eye Distance:   Left Eye Distance:   Bilateral Distance:    Right Eye Near:   Left Eye Near:    Bilateral Near:         MDM   1. Pharyngitis, unspecified etiology   2. Sore throat     Patient is reassured that there are no issues that require transfer to higher level of care at this time or additional tests. Patient is advised to continue home symptomatic treatment. Patient is advised that if there are new or worsening symptoms to attend the emergency department, contact primary care provider, or return to UC. Instructions of care provided discharged home in stable condition.    THIS NOTE WAS GENERATED USING A VOICE RECOGNITION SOFTWARE PROGRAM. ALL REASONABLE EFFORTS  WERE MADE TO PROOFREAD THIS DOCUMENT FOR ACCURACY.  I have verbally reviewed the discharge instructions with the patient. A printed AVS was given to the patient.  All questions were answered prior to discharge.      Tharon AquasFrank C Patrick, PA 03/01/16 1438

## 2016-03-02 LAB — CULTURE, GROUP A STREP (THRC)
